# Patient Record
Sex: Female | Born: 1980 | Race: White | Hispanic: No | Marital: Married | Smoking: Never smoker
Health system: Southern US, Community
[De-identification: ages and names within clinical notes are randomized; demographics above are authoritative.]

## PROBLEM LIST (undated history)

## (undated) DIAGNOSIS — F419 Anxiety disorder, unspecified: Secondary | ICD-10-CM

## (undated) DIAGNOSIS — F329 Major depressive disorder, single episode, unspecified: Secondary | ICD-10-CM

## (undated) DIAGNOSIS — R87629 Unspecified abnormal cytological findings in specimens from vagina: Secondary | ICD-10-CM

## (undated) DIAGNOSIS — F32A Depression, unspecified: Secondary | ICD-10-CM

## (undated) DIAGNOSIS — IMO0002 Reserved for concepts with insufficient information to code with codable children: Secondary | ICD-10-CM

## (undated) HISTORY — DX: Depression, unspecified: F32.A

## (undated) HISTORY — DX: Anxiety disorder, unspecified: F41.9

## (undated) HISTORY — PX: EYE SURGERY: SHX253

## (undated) HISTORY — DX: Unspecified abnormal cytological findings in specimens from vagina: R87.629

## (undated) HISTORY — DX: Major depressive disorder, single episode, unspecified: F32.9

---
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
## 2002-02-10 ENCOUNTER — Other Ambulatory Visit: Admission: RE | Admit: 2002-02-10 | Discharge: 2002-02-10 | Payer: Self-pay | Admitting: Internal Medicine

## 2003-03-16 ENCOUNTER — Other Ambulatory Visit: Admission: RE | Admit: 2003-03-16 | Discharge: 2003-03-16 | Payer: Self-pay | Admitting: Internal Medicine

## 2004-03-21 ENCOUNTER — Other Ambulatory Visit: Admission: RE | Admit: 2004-03-21 | Discharge: 2004-03-21 | Payer: Self-pay | Admitting: Family Medicine

## 2005-04-10 ENCOUNTER — Other Ambulatory Visit: Admission: RE | Admit: 2005-04-10 | Discharge: 2005-04-10 | Payer: Self-pay | Admitting: Internal Medicine

## 2005-06-03 ENCOUNTER — Emergency Department (HOSPITAL_COMMUNITY): Admission: EM | Admit: 2005-06-03 | Discharge: 2005-06-03 | Payer: Self-pay | Admitting: Emergency Medicine

## 2006-04-12 ENCOUNTER — Other Ambulatory Visit: Admission: RE | Admit: 2006-04-12 | Discharge: 2006-04-12 | Payer: Self-pay | Admitting: Internal Medicine

## 2007-04-15 ENCOUNTER — Other Ambulatory Visit: Admission: RE | Admit: 2007-04-15 | Discharge: 2007-04-15 | Payer: Self-pay | Admitting: *Deleted

## 2008-04-16 ENCOUNTER — Other Ambulatory Visit: Admission: RE | Admit: 2008-04-16 | Discharge: 2008-04-16 | Payer: Self-pay | Admitting: Family Medicine

## 2008-11-03 ENCOUNTER — Emergency Department (HOSPITAL_COMMUNITY): Admission: EM | Admit: 2008-11-03 | Discharge: 2008-11-03 | Payer: Self-pay | Admitting: Emergency Medicine

## 2009-09-11 IMAGING — CR DG CHEST 2V
2 series · 2 of 2 positions shown · non-contrast
Comparison: None

CLINICAL DATA: Pain, no trauma

CHEST - 2 VIEW

[w chest pa]
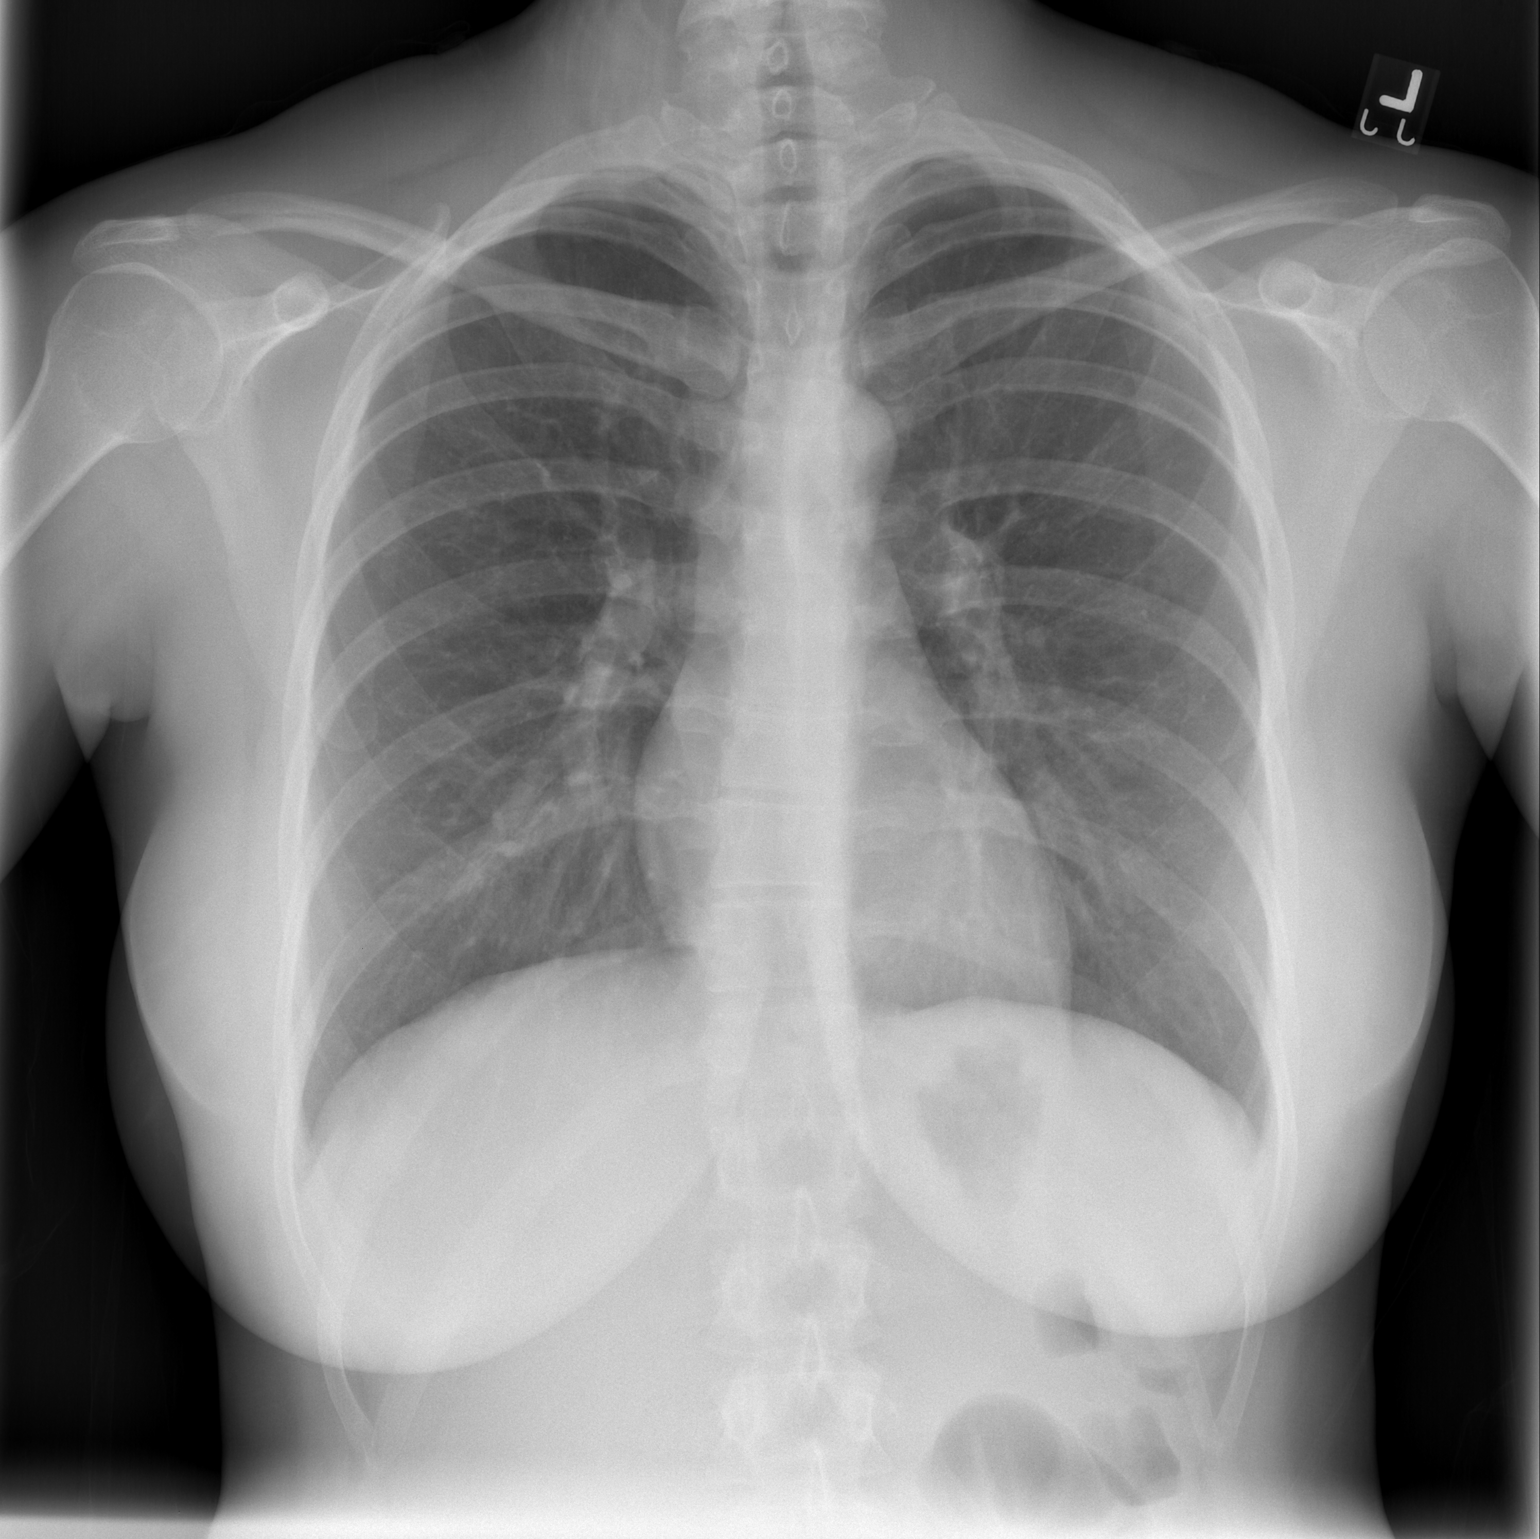

[w chest lat]
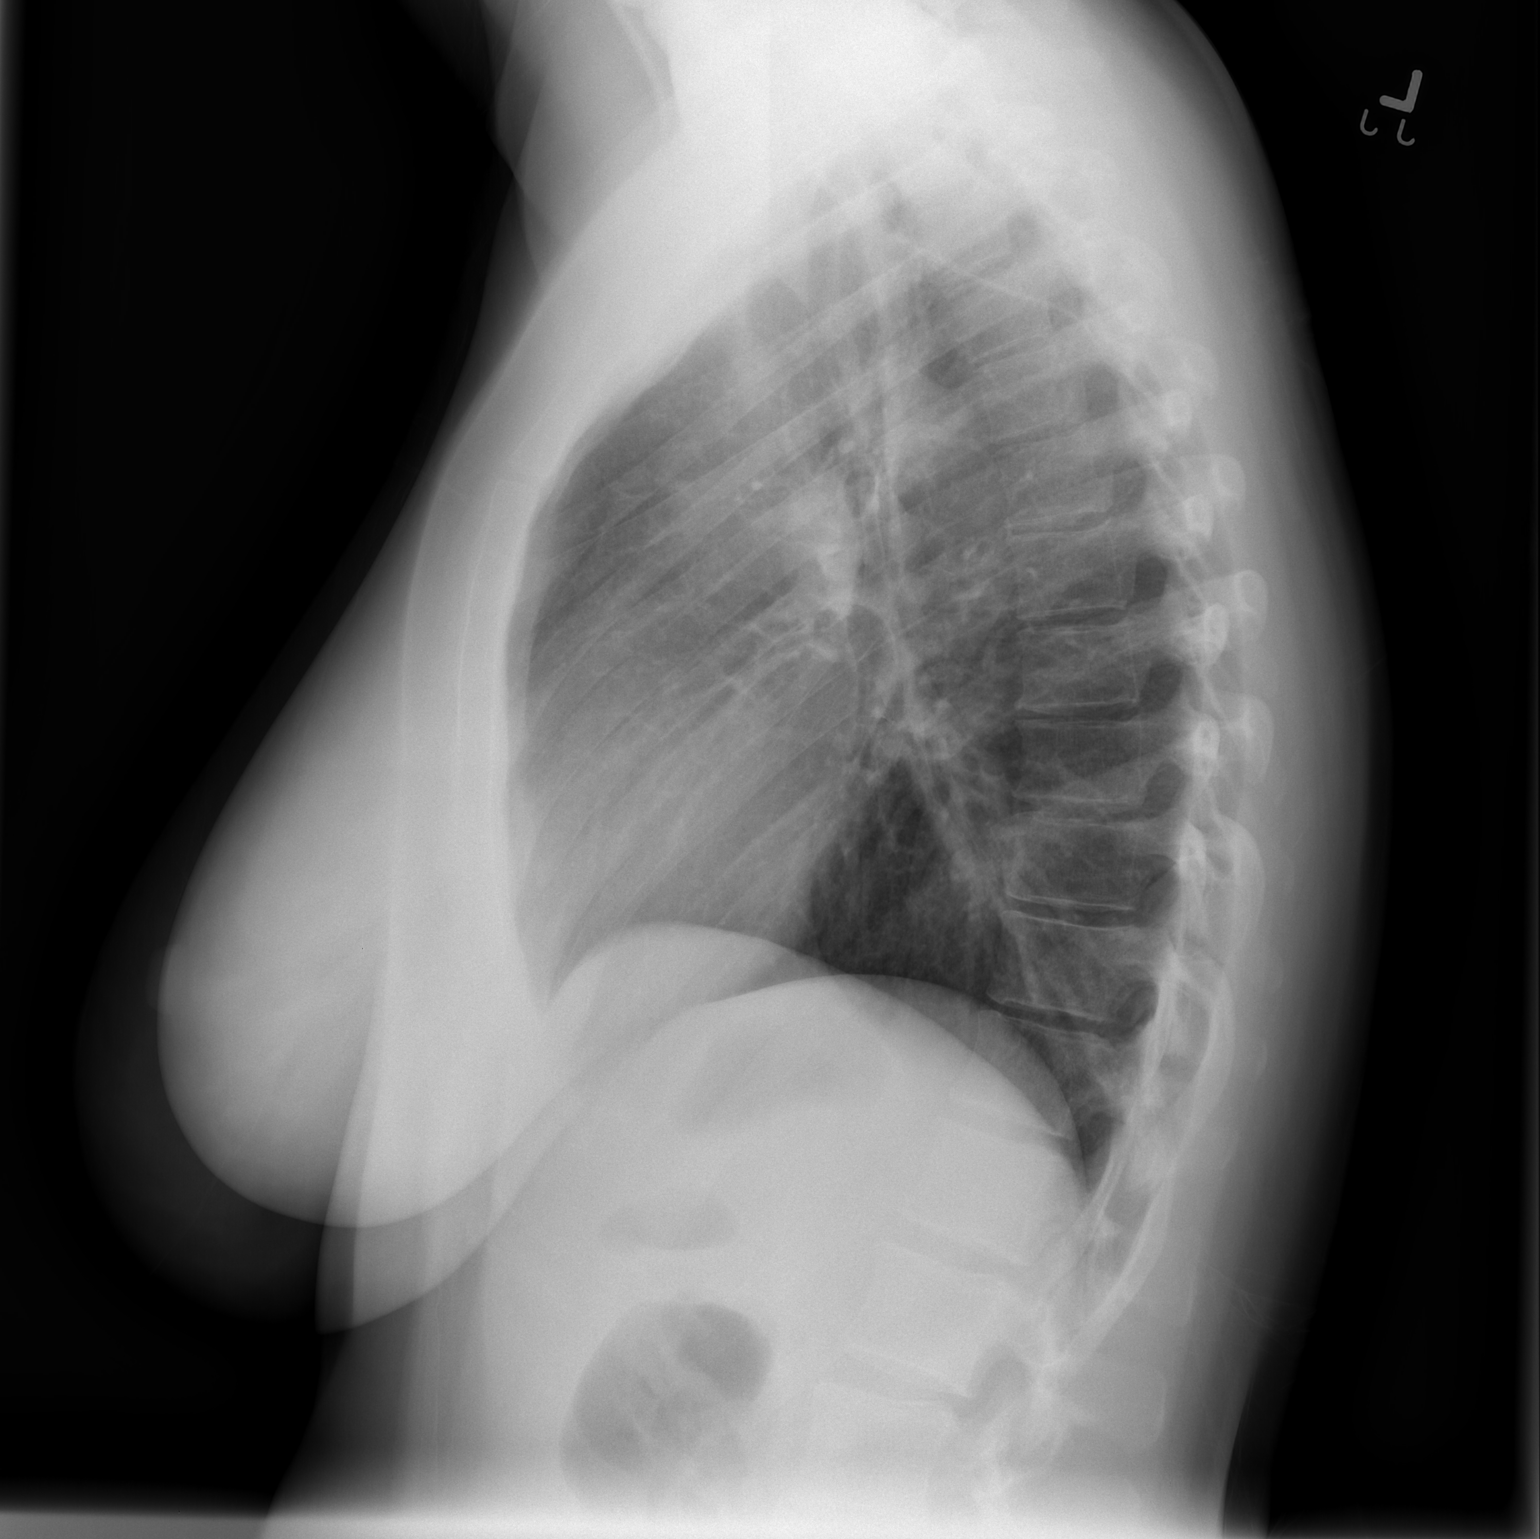

[2 of 2 positions shown; findings below may reference images not displayed]

FINDINGS: Cardiomediastinal silhouette is unremarkable.  Bony
thorax is unremarkable.  No acute infiltrate or edema.  No
pneumothorax.
IMPRESSION: No active disease.

## 2010-04-28 ENCOUNTER — Other Ambulatory Visit: Admission: RE | Admit: 2010-04-28 | Discharge: 2010-04-28 | Payer: Self-pay | Admitting: *Deleted

## 2010-11-07 ENCOUNTER — Other Ambulatory Visit (HOSPITAL_COMMUNITY)
Admission: RE | Admit: 2010-11-07 | Discharge: 2010-11-07 | Disposition: A | Discharge status: Home / Self Care | Payer: BC Managed Care – PPO | Source: Ambulatory Visit | Attending: Internal Medicine | Admitting: Internal Medicine

## 2010-11-07 ENCOUNTER — Other Ambulatory Visit: Payer: Self-pay | Admitting: Physician Assistant

## 2010-11-07 DIAGNOSIS — Z01419 Encounter for gynecological examination (general) (routine) without abnormal findings: Secondary | ICD-10-CM | POA: Insufficient documentation

## 2011-05-10 ENCOUNTER — Other Ambulatory Visit (HOSPITAL_COMMUNITY)
Admission: RE | Admit: 2011-05-10 | Discharge: 2011-05-10 | Disposition: A | Discharge status: Home / Self Care | Payer: BC Managed Care – PPO | Source: Ambulatory Visit | Attending: Family Medicine | Admitting: Family Medicine

## 2011-05-10 ENCOUNTER — Other Ambulatory Visit: Payer: Self-pay | Admitting: Physician Assistant

## 2011-05-10 DIAGNOSIS — Z01419 Encounter for gynecological examination (general) (routine) without abnormal findings: Secondary | ICD-10-CM | POA: Insufficient documentation

## 2011-07-22 HISTORY — PX: BACK SURGERY: SHX140

## 2011-08-28 ENCOUNTER — Ambulatory Visit: Payer: BC Managed Care – PPO | Attending: Neurosurgery | Admitting: Physical Therapy

## 2011-08-28 DIAGNOSIS — IMO0001 Reserved for inherently not codable concepts without codable children: Secondary | ICD-10-CM | POA: Insufficient documentation

## 2011-08-28 DIAGNOSIS — M545 Low back pain, unspecified: Secondary | ICD-10-CM | POA: Insufficient documentation

## 2011-08-28 DIAGNOSIS — R5381 Other malaise: Secondary | ICD-10-CM | POA: Insufficient documentation

## 2011-08-31 ENCOUNTER — Ambulatory Visit: Payer: BC Managed Care – PPO | Admitting: Physical Therapy

## 2011-09-05 ENCOUNTER — Ambulatory Visit: Payer: BC Managed Care – PPO | Admitting: Physical Therapy

## 2011-09-07 ENCOUNTER — Ambulatory Visit: Payer: BC Managed Care – PPO | Admitting: Physical Therapy

## 2011-09-12 ENCOUNTER — Ambulatory Visit: Payer: BC Managed Care – PPO | Admitting: Physical Therapy

## 2011-09-14 ENCOUNTER — Ambulatory Visit: Payer: BC Managed Care – PPO | Admitting: Physical Therapy

## 2011-09-19 ENCOUNTER — Ambulatory Visit: Payer: BC Managed Care – PPO | Admitting: Physical Therapy

## 2011-09-21 ENCOUNTER — Ambulatory Visit: Payer: BC Managed Care – PPO | Admitting: Physical Therapy

## 2011-09-26 ENCOUNTER — Ambulatory Visit: Payer: BC Managed Care – PPO | Attending: Neurosurgery | Admitting: *Deleted

## 2011-09-26 DIAGNOSIS — IMO0001 Reserved for inherently not codable concepts without codable children: Secondary | ICD-10-CM | POA: Insufficient documentation

## 2011-09-26 DIAGNOSIS — M545 Low back pain, unspecified: Secondary | ICD-10-CM | POA: Insufficient documentation

## 2011-09-26 DIAGNOSIS — R5381 Other malaise: Secondary | ICD-10-CM | POA: Insufficient documentation

## 2011-09-28 ENCOUNTER — Ambulatory Visit: Payer: BC Managed Care – PPO | Admitting: *Deleted

## 2011-10-03 ENCOUNTER — Ambulatory Visit: Payer: BC Managed Care – PPO | Admitting: *Deleted

## 2011-10-05 ENCOUNTER — Ambulatory Visit: Payer: BC Managed Care – PPO | Admitting: *Deleted

## 2011-10-10 ENCOUNTER — Ambulatory Visit: Payer: BC Managed Care – PPO | Admitting: Physical Therapy

## 2011-10-12 ENCOUNTER — Ambulatory Visit: Payer: BC Managed Care – PPO | Admitting: Physical Therapy

## 2011-10-16 ENCOUNTER — Encounter: Payer: BC Managed Care – PPO | Admitting: Physical Therapy

## 2011-10-18 ENCOUNTER — Encounter: Payer: BC Managed Care – PPO | Admitting: Physical Therapy

## 2011-10-19 ENCOUNTER — Ambulatory Visit: Payer: BC Managed Care – PPO | Admitting: *Deleted

## 2011-10-31 ENCOUNTER — Ambulatory Visit: Payer: BC Managed Care – PPO | Attending: Neurosurgery | Admitting: Physical Therapy

## 2011-10-31 DIAGNOSIS — M545 Low back pain, unspecified: Secondary | ICD-10-CM | POA: Insufficient documentation

## 2011-10-31 DIAGNOSIS — IMO0001 Reserved for inherently not codable concepts without codable children: Secondary | ICD-10-CM | POA: Insufficient documentation

## 2011-10-31 DIAGNOSIS — R5381 Other malaise: Secondary | ICD-10-CM | POA: Insufficient documentation

## 2011-11-01 ENCOUNTER — Ambulatory Visit: Payer: BC Managed Care – PPO | Admitting: Physical Therapy

## 2011-11-02 ENCOUNTER — Encounter: Payer: BC Managed Care – PPO | Admitting: Physical Therapy

## 2011-11-08 ENCOUNTER — Encounter: Payer: BC Managed Care – PPO | Admitting: Physical Therapy

## 2011-11-09 ENCOUNTER — Ambulatory Visit: Payer: BC Managed Care – PPO | Admitting: Physical Therapy

## 2012-05-14 ENCOUNTER — Other Ambulatory Visit: Payer: Self-pay | Admitting: Family Medicine

## 2012-05-14 ENCOUNTER — Other Ambulatory Visit (HOSPITAL_COMMUNITY)
Admission: RE | Admit: 2012-05-14 | Discharge: 2012-05-14 | Disposition: A | Discharge status: Home / Self Care | Payer: BC Managed Care – PPO | Source: Ambulatory Visit | Attending: Family Medicine | Admitting: Family Medicine

## 2012-05-14 DIAGNOSIS — Z01419 Encounter for gynecological examination (general) (routine) without abnormal findings: Secondary | ICD-10-CM | POA: Insufficient documentation

## 2013-08-21 NOTE — L&D Delivery Note (Signed)
Delivery Note At 9:01 AM a viable female was delivered via OA Presentation Apgars 9 9 Weight pending Placenta status:spontaneously with 3 vessel cord and intact , .  Cord:  with the following complications:nuchal x 1 .  Cord pH: not obtained  Anesthesia:  epidural Episiotomy: none Lacerations: 2nd Suture Repair: 3.0 chromic Est. Blood Loss (mL): 300  Mom to postpartum.  Baby to Couplet care / Skin to Skin.  Tyjay Galindo L 04/23/2014, 9:16 AM

## 2013-09-09 LAB — OB RESULTS CONSOLE RUBELLA ANTIBODY, IGM: Rubella: IMMUNE

## 2013-09-09 LAB — OB RESULTS CONSOLE RPR: RPR: NONREACTIVE

## 2013-09-09 LAB — OB RESULTS CONSOLE GC/CHLAMYDIA
Chlamydia: NEGATIVE
Gonorrhea: NEGATIVE

## 2013-09-09 LAB — OB RESULTS CONSOLE ABO/RH: RH TYPE: POSITIVE

## 2013-09-09 LAB — OB RESULTS CONSOLE ANTIBODY SCREEN: ANTIBODY SCREEN: NEGATIVE

## 2013-09-09 LAB — OB RESULTS CONSOLE HEPATITIS B SURFACE ANTIGEN: HEP B S AG: NEGATIVE

## 2013-09-09 LAB — OB RESULTS CONSOLE HIV ANTIBODY (ROUTINE TESTING): HIV: NONREACTIVE

## 2014-04-22 ENCOUNTER — Telehealth (HOSPITAL_COMMUNITY): Payer: Self-pay | Admitting: *Deleted

## 2014-04-22 ENCOUNTER — Encounter (HOSPITAL_COMMUNITY): Payer: Self-pay | Admitting: *Deleted

## 2014-04-22 ENCOUNTER — Inpatient Hospital Stay (HOSPITAL_COMMUNITY): Payer: BC Managed Care – PPO

## 2014-04-22 LAB — OB RESULTS CONSOLE GBS: STREP GROUP B AG: POSITIVE

## 2014-04-22 NOTE — Telephone Encounter (Signed)
Preadmission screen  

## 2014-04-23 ENCOUNTER — Inpatient Hospital Stay (HOSPITAL_COMMUNITY)
Admission: AD | Admit: 2014-04-23 | Discharge: 2014-04-25 | DRG: 775 | Disposition: A | Discharge status: Home / Self Care | Payer: BC Managed Care – PPO | Source: Ambulatory Visit | Attending: Obstetrics and Gynecology | Admitting: Obstetrics and Gynecology

## 2014-04-23 ENCOUNTER — Encounter (HOSPITAL_COMMUNITY): Payer: Self-pay

## 2014-04-23 ENCOUNTER — Inpatient Hospital Stay (HOSPITAL_COMMUNITY): Payer: BC Managed Care – PPO | Admitting: Anesthesiology

## 2014-04-23 ENCOUNTER — Encounter (HOSPITAL_COMMUNITY): Payer: BC Managed Care – PPO | Admitting: Anesthesiology

## 2014-04-23 DIAGNOSIS — IMO0001 Reserved for inherently not codable concepts without codable children: Secondary | ICD-10-CM

## 2014-04-23 DIAGNOSIS — Z833 Family history of diabetes mellitus: Secondary | ICD-10-CM

## 2014-04-23 DIAGNOSIS — IMO0002 Reserved for concepts with insufficient information to code with codable children: Secondary | ICD-10-CM | POA: Diagnosis present

## 2014-04-23 DIAGNOSIS — O99344 Other mental disorders complicating childbirth: Secondary | ICD-10-CM | POA: Diagnosis present

## 2014-04-23 DIAGNOSIS — F341 Dysthymic disorder: Secondary | ICD-10-CM | POA: Diagnosis present

## 2014-04-23 DIAGNOSIS — Z22330 Carrier of Group B streptococcus: Secondary | ICD-10-CM | POA: Diagnosis not present

## 2014-04-23 DIAGNOSIS — O479 False labor, unspecified: Secondary | ICD-10-CM | POA: Diagnosis present

## 2014-04-23 DIAGNOSIS — O9989 Other specified diseases and conditions complicating pregnancy, childbirth and the puerperium: Secondary | ICD-10-CM

## 2014-04-23 DIAGNOSIS — O99892 Other specified diseases and conditions complicating childbirth: Secondary | ICD-10-CM | POA: Diagnosis present

## 2014-04-23 HISTORY — DX: Reserved for concepts with insufficient information to code with codable children: IMO0002

## 2014-04-23 LAB — CBC
HCT: 34.3 % — ABNORMAL LOW (ref 36.0–46.0)
Hemoglobin: 12.2 g/dL (ref 12.0–15.0)
MCH: 32.8 pg (ref 26.0–34.0)
MCHC: 35.6 g/dL (ref 30.0–36.0)
MCV: 92.2 fL (ref 78.0–100.0)
PLATELETS: 173 10*3/uL (ref 150–400)
RBC: 3.72 MIL/uL — ABNORMAL LOW (ref 3.87–5.11)
RDW: 12.6 % (ref 11.5–15.5)
WBC: 14.8 10*3/uL — ABNORMAL HIGH (ref 4.0–10.5)

## 2014-04-23 LAB — RPR

## 2014-04-23 MED ORDER — ACETAMINOPHEN 325 MG PO TABS: 650.0000 mg | ORAL_TABLET | ORAL | Status: DC | PRN

## 2014-04-23 MED ORDER — ONDANSETRON HCL 4 MG/2ML IJ SOLN: 4.0000 mg | INTRAMUSCULAR | Status: DC | PRN

## 2014-04-23 MED ORDER — FLEET ENEMA 7-19 GM/118ML RE ENEM: 1.0000 | ENEMA | Freq: Every day | RECTAL | Status: DC | PRN

## 2014-04-23 MED ORDER — ZOLPIDEM TARTRATE 5 MG PO TABS: 5.0000 mg | ORAL_TABLET | Freq: Every evening | ORAL | Status: DC | PRN

## 2014-04-23 MED ORDER — OXYTOCIN BOLUS FROM INFUSION
500.0000 mL | INTRAVENOUS | Status: DC
  Administered 2014-04-23: 500 mL via INTRAVENOUS

## 2014-04-23 MED ORDER — CITRIC ACID-SODIUM CITRATE 334-500 MG/5ML PO SOLN: 30.0000 mL | ORAL | Status: DC | PRN

## 2014-04-23 MED ORDER — DIBUCAINE 1 % RE OINT
1.0000 "application " | TOPICAL_OINTMENT | RECTAL | Status: DC | PRN
  Filled 2014-04-23: qty 28

## 2014-04-23 MED ORDER — LIDOCAINE HCL (PF) 1 % IJ SOLN
30.0000 mL | INTRAMUSCULAR | Status: DC | PRN
  Filled 2014-04-23: qty 30

## 2014-04-23 MED ORDER — OXYCODONE-ACETAMINOPHEN 5-325 MG PO TABS: 2.0000 | ORAL_TABLET | ORAL | Status: DC | PRN

## 2014-04-23 MED ORDER — LACTATED RINGERS IV SOLN
INTRAVENOUS | Status: DC
  Administered 2014-04-23: 125 mL/h via INTRAVENOUS
  Administered 2014-04-23: 09:00:00 via INTRAVENOUS

## 2014-04-23 MED ORDER — BENZOCAINE-MENTHOL 20-0.5 % EX AERO
1.0000 "application " | INHALATION_SPRAY | CUTANEOUS | Status: DC | PRN
  Administered 2014-04-23: 1 via TOPICAL
  Filled 2014-04-23 (×3): qty 56

## 2014-04-23 MED ORDER — OXYTOCIN 40 UNITS IN LACTATED RINGERS INFUSION - SIMPLE MED
62.5000 mL/h | INTRAVENOUS | Status: DC
  Administered 2014-04-23: 62.5 mL/h via INTRAVENOUS
  Filled 2014-04-23: qty 1000

## 2014-04-23 MED ORDER — ONDANSETRON HCL 4 MG/2ML IJ SOLN: 4.0000 mg | Freq: Four times a day (QID) | INTRAMUSCULAR | Status: DC | PRN

## 2014-04-23 MED ORDER — EPHEDRINE 5 MG/ML INJ: 10.0000 mg | INTRAVENOUS | Status: DC | PRN

## 2014-04-23 MED ORDER — MEDROXYPROGESTERONE ACETATE 150 MG/ML IM SUSP: 150.0000 mg | INTRAMUSCULAR | Status: DC | PRN

## 2014-04-23 MED ORDER — PRENATAL MULTIVITAMIN CH
1.0000 | ORAL_TABLET | Freq: Every day | ORAL | Status: DC
  Administered 2014-04-23 – 2014-04-24 (×2): 1 via ORAL
  Filled 2014-04-23 (×2): qty 1

## 2014-04-23 MED ORDER — OXYCODONE-ACETAMINOPHEN 5-325 MG PO TABS: 1.0000 | ORAL_TABLET | ORAL | Status: DC | PRN

## 2014-04-23 MED ORDER — PHENYLEPHRINE 40 MCG/ML (10ML) SYRINGE FOR IV PUSH (FOR BLOOD PRESSURE SUPPORT)
80.0000 ug | PREFILLED_SYRINGE | INTRAVENOUS | Status: DC | PRN
  Filled 2014-04-23: qty 10

## 2014-04-23 MED ORDER — DIPHENHYDRAMINE HCL 50 MG/ML IJ SOLN: 12.5000 mg | INTRAMUSCULAR | Status: DC | PRN

## 2014-04-23 MED ORDER — ONDANSETRON HCL 4 MG PO TABS: 4.0000 mg | ORAL_TABLET | ORAL | Status: DC | PRN

## 2014-04-23 MED ORDER — IBUPROFEN 600 MG PO TABS
600.0000 mg | ORAL_TABLET | Freq: Four times a day (QID) | ORAL | Status: DC
  Administered 2014-04-23 – 2014-04-25 (×8): 600 mg via ORAL
  Filled 2014-04-23 (×8): qty 1

## 2014-04-23 MED ORDER — MEASLES, MUMPS & RUBELLA VAC ~~LOC~~ INJ: 0.5000 mL | INJECTION | Freq: Once | SUBCUTANEOUS | Status: DC

## 2014-04-23 MED ORDER — SENNOSIDES-DOCUSATE SODIUM 8.6-50 MG PO TABS
2.0000 | ORAL_TABLET | ORAL | Status: DC
  Administered 2014-04-24: 2 via ORAL
  Filled 2014-04-23 (×2): qty 2

## 2014-04-23 MED ORDER — LANOLIN HYDROUS EX OINT: TOPICAL_OINTMENT | CUTANEOUS | Status: DC | PRN

## 2014-04-23 MED ORDER — FENTANYL 2.5 MCG/ML BUPIVACAINE 1/10 % EPIDURAL INFUSION (WH - ANES)
14.0000 mL/h | INTRAMUSCULAR | Status: DC | PRN
  Administered 2014-04-23: 14 mL/h via EPIDURAL
  Filled 2014-04-23: qty 125

## 2014-04-23 MED ORDER — FLEET ENEMA 7-19 GM/118ML RE ENEM: 1.0000 | ENEMA | RECTAL | Status: DC | PRN

## 2014-04-23 MED ORDER — LACTATED RINGERS IV SOLN
500.0000 mL | INTRAVENOUS | Status: DC | PRN
Start: 2014-04-23 — End: 2014-04-23

## 2014-04-23 MED ORDER — BISACODYL 10 MG RE SUPP
10.0000 mg | Freq: Every day | RECTAL | Status: DC | PRN
  Filled 2014-04-23: qty 1

## 2014-04-23 MED ORDER — DIPHENHYDRAMINE HCL 25 MG PO CAPS: 25.0000 mg | ORAL_CAPSULE | Freq: Four times a day (QID) | ORAL | Status: DC | PRN

## 2014-04-23 MED ORDER — SIMETHICONE 80 MG PO CHEW: 80.0000 mg | CHEWABLE_TABLET | ORAL | Status: DC | PRN

## 2014-04-23 MED ORDER — CLINDAMYCIN PHOSPHATE 900 MG/50ML IV SOLN
900.0000 mg | Freq: Three times a day (TID) | INTRAVENOUS | Status: DC
  Administered 2014-04-23: 900 mg via INTRAVENOUS
  Filled 2014-04-23 (×2): qty 50

## 2014-04-23 MED ORDER — LACTATED RINGERS IV SOLN
500.0000 mL | Freq: Once | INTRAVENOUS | Status: AC
  Administered 2014-04-23: 1000 mL via INTRAVENOUS

## 2014-04-23 MED ORDER — LIDOCAINE HCL (PF) 1 % IJ SOLN
INTRAMUSCULAR | Status: DC | PRN
  Administered 2014-04-23 (×2): 5 mL

## 2014-04-23 MED ORDER — OXYCODONE-ACETAMINOPHEN 5-325 MG PO TABS
1.0000 | ORAL_TABLET | ORAL | Status: DC | PRN
  Administered 2014-04-23 – 2014-04-24 (×3): 1 via ORAL
  Filled 2014-04-23 (×3): qty 1

## 2014-04-23 MED ORDER — WITCH HAZEL-GLYCERIN EX PADS: 1.0000 "application " | MEDICATED_PAD | CUTANEOUS | Status: DC | PRN

## 2014-04-23 MED ORDER — TETANUS-DIPHTH-ACELL PERTUSSIS 5-2.5-18.5 LF-MCG/0.5 IM SUSP: 0.5000 mL | Freq: Once | INTRAMUSCULAR | Status: DC

## 2014-04-23 MED ORDER — PHENYLEPHRINE 40 MCG/ML (10ML) SYRINGE FOR IV PUSH (FOR BLOOD PRESSURE SUPPORT): 80.0000 ug | PREFILLED_SYRINGE | INTRAVENOUS | Status: DC | PRN

## 2014-04-23 NOTE — Lactation Note (Signed)
This note was copied from the chart of Bailey Wright. Lactation Consultation Note  Patient Name: Bailey Wright Today's Date: 04/23/2014 Reason for consult: Initial assessment of this primipara and her newborn at 40 hours postpartum.  Mom says baby just finished breastfeeding for 10 minutes on her right breast.  Mom holding baby STS.  LC demonstrated hand expression and encouraged frequent STS and cue feedings.  Baby has breastfed several times since birth with LATCH scores of 7/9 per RN assessment. Mom encouraged to feed baby 8-12 times/24 hours and with feeding cues. LC encouraged review of Baby and Me pp 9, 14 and 20-25 for STS and BF information. LC provided [employer] brochure and reviewed [facility] services and list of community and web site resources.     Maternal Data Formula Feeding for Exclusion: No Has patient been taught Hand Expression?: Yes (LC demonstrated hand expression technique) Does the patient have breastfeeding experience prior to this delivery?: No  Feeding    LATCH Score/Interventions           LATCH scores=7/9           Lactation Tools Discussed/Used   STS, cue feedings, hand expression  Consult Status Consult Status: Follow-up Date: 04/24/14 Follow-up type: In-patient    Warrick Parisian [facility] 04/23/2014, 8:47 PM

## 2014-04-23 NOTE — Anesthesia Preprocedure Evaluation (Signed)
Anesthesia Evaluation  Patient identified by MRN, date of birth, ID band Patient awake    Reviewed: Allergy & Precautions, H&P , Patient's Chart, lab work & pertinent test results  Airway Mallampati: II TM Distance: >3 FB Neck ROM: full    Dental   Pulmonary  breath sounds clear to auscultation        Cardiovascular Rhythm:regular Rate:Normal     Neuro/Psych    GI/Hepatic   Endo/Other    Renal/GU      Musculoskeletal   Abdominal   Peds  Hematology   Anesthesia Other Findings S/p back surgery  Reproductive/Obstetrics (+) Pregnancy                           Anesthesia Physical Anesthesia Plan  ASA: II  Anesthesia Plan: Epidural   Post-op Pain Management:    Induction:   Airway Management Planned:   Additional Equipment:   Intra-op Plan:   Post-operative Plan:   Informed Consent: I have reviewed the patients History and Physical, chart, labs and discussed the procedure including the risks, benefits and alternatives for the proposed anesthesia with the patient or authorized representative who has indicated his/her understanding and acceptance.     Plan Discussed with:   Anesthesia Plan Comments:         Anesthesia Quick Evaluation

## 2014-04-23 NOTE — Anesthesia Postprocedure Evaluation (Signed)
Anesthesia Post Note  Patient: Bailey Wright  Procedure(s) Performed: * No procedures listed *  Anesthesia type: Epidural  Patient location: Mother/Baby  Post pain: Pain level controlled  Post assessment: Post-op Vital signs reviewed  Last Vitals:  Filed Vitals:   04/23/14 1031  BP: 125/74  Pulse: 73  Temp:   Resp: 20    Post vital signs: Reviewed  Level of consciousness:alert  Complications: No apparent anesthesia complications

## 2014-04-23 NOTE — MAU Note (Signed)
Contractions since 4am. Denies LOF. + bloody show. +FM.

## 2014-04-23 NOTE — Anesthesia Procedure Notes (Signed)
Epidural Patient location during procedure: OB Start time: 04/23/2014 8:14 AM  Staffing Anesthesiologist: Brayton Caves Performed by: anesthesiologist   Preanesthetic Checklist Completed: patient identified, site marked, surgical consent, pre-op evaluation, timeout performed, IV checked, risks and benefits discussed and monitors and equipment checked  Epidural Patient position: sitting Prep: site prepped and draped and DuraPrep Patient monitoring: continuous pulse ox and blood pressure Approach: midline Location: L2-L3 Injection technique: LOR air  Needle:  Needle type: Tuohy  Needle gauge: 17 G Needle length: 9 cm and 9 Needle insertion depth: 6 cm Catheter type: closed end flexible Catheter size: 19 Gauge Catheter at skin depth: 11 cm Test dose: negative  Assessment Events: blood not aspirated, injection not painful, no injection resistance, negative IV test and no paresthesia  Additional Notes Patient identified.  Risk benefits discussed including failed block, incomplete pain control, headache, nerve damage, paralysis, blood pressure changes, nausea, vomiting, reactions to medication both toxic or allergic, and postpartum back pain.  Patient expressed understanding and wished to proceed.  All questions were answered.  Sterile technique used throughout procedure and epidural site dressed with sterile barrier dressing. No paresthesia or other complications noted.The patient did not experience any signs of intravascular injection such as tinnitus or metallic taste in mouth nor signs of intrathecal spread such as rapid motor block. Please see nursing notes for vital signs.

## 2014-04-23 NOTE — H&P (Signed)
Bailey Wright is a 33 y.o. G 1 P 0 at 40 w 3 days presents in active labor. Was 6 cm in triage now C.Status post epidural Maternal Medical History:  Reason for admission: Contractions.   Contractions: Perceived severity is moderate.    Fetal activity: Perceived fetal activity is normal.      OB History   Grav Para Term Preterm Abortions TAB SAB Ect Mult Living       Past Medical History  Diagnosis Date  . Vaginal Pap smear, abnormal   . Anxiety   . Depression   . Degenerative disc disease     diagnosed in high school   Past Surgical History  Procedure Laterality Date  . Eye surgery      lasik  . Back surgery  Dec. 2012    ruptured disc repair   Family History: family history includes Cancer in her maternal grandmother; Diabetes in her maternal grandmother and sister; Heart disease in her maternal grandfather; Thyroid disease in her mother. Social History:  reports that she has never smoked. She has never used smokeless tobacco. She reports that she does not drink alcohol or use illicit drugs.   Prenatal Transfer Tool  Maternal Diabetes: No Genetic Screening: Normal Maternal Ultrasounds/Referrals: Normal Fetal Ultrasounds or other Referrals:  None Maternal Substance Abuse:  No Significant Maternal Medications:  None Significant Maternal Lab Results:  None Other Comments:  None  Review of Systems  All other systems reviewed and are negative.   Dilation: 6 (BBOW) Effacement (%): 90;100 Station: -1 Exam by:: D Simpson RN Blood pressure 127/71, pulse 76, temperature 98.4 F (36.9 C), temperature source Oral, resp. rate 18, height 5' 5.5" (1.664 m), weight 98.068 kg (216 lb 3.2 oz), last menstrual period 07/14/2013. Maternal Exam:  Uterine Assessment: Contraction strength is moderate.  Contraction frequency is regular.   Abdomen: Fetal presentation: vertex     Fetal Exam Fetal State Assessment: Category I - tracings are  normal.     Physical Exam  Nursing note and vitals reviewed. Constitutional: She appears well-developed and well-nourished.  HENT:  Head: Normocephalic.  Eyes: Pupils are equal, round, and reactive to light.  Cardiovascular: Normal rate and regular rhythm.   Respiratory: Effort normal.    Prenatal labs: ABO, Rh: B/Positive/-- (01/20 0000) Antibody: Negative (01/20 0000) Rubella: Immune (01/20 0000) RPR: Nonreactive (01/20 0000)  HBsAg: Negative (01/20 0000)  HIV: Non-reactive (01/20 0000)  GBS: Positive (09/02 0000)   Assessment/Plan: IUP at 40 w 3 days Labor Anticipate NSVD GBBS + antibiotic prophylaxis  Bailey Wright 04/23/2014, 8:36 AM

## 2014-04-24 LAB — CBC
HCT: 29.3 % — ABNORMAL LOW (ref 36.0–46.0)
Hemoglobin: 10.1 g/dL — ABNORMAL LOW (ref 12.0–15.0)
MCH: 32.4 pg (ref 26.0–34.0)
MCHC: 34.5 g/dL (ref 30.0–36.0)
MCV: 93.9 fL (ref 78.0–100.0)
PLATELETS: 153 10*3/uL (ref 150–400)
RBC: 3.12 MIL/uL — ABNORMAL LOW (ref 3.87–5.11)
RDW: 12.9 % (ref 11.5–15.5)
WBC: 12.8 10*3/uL — ABNORMAL HIGH (ref 4.0–10.5)

## 2014-04-24 NOTE — Progress Notes (Signed)
Clinical Social Work Department PSYCHOSOCIAL ASSESSMENT - MATERNAL/CHILD 04/24/2014  Patient:  Bailey Wright, Bailey Wright  Account Number:  [id number]  Admit Date:  04/23/2014  Ardine Eng Name:   Lurline Hare   Clinical Social Worker:  Lucita Ferrara, CLINICAL SOCIAL WORKER   Date/Time:  04/24/2014 12:15 PM  Date Referred:  04/24/2014   Referral source  Central Nursery     Referred reason  Depression/Anxiety   Other referral source:    I:  FAMILY / [place] legal guardian:  PARENT  Guardian - Name Guardian - Age Guardian - Address  Courtnee Myer [address], [place] [address]  Tyler Olson  Same as above   Other household support members/support persons Other support:   MOB and FOB discussed that both sets of their parents live nearby, and confirmed positive and supportive relationships with them. They shared that they feel well supported by their family as they become first time parents.    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   Financial resources:  Multimedia programmer If Concepcion:    School / Grade:  N/A Music therapist / Child Services Coordination / Early Interventions:   N/A  Cultural issues impacting care:   None reported    III  STRENGTHS Strengths  Adequate Resources  Home prepared for Child (including basic supplies)  Supportive family/friends   Strength comment:    IV  RISK FACTORS AND CURRENT PROBLEMS Current Problem:  YES   Risk Factor & Current Problem Patient Issue Family Issue Risk Factor / Current Problem Comment  Mental Illness Y N MOB presents with history of anxiety and depression.  MOB reported that she received treatment in the past, but is currently not taking any medications or in therapy due to not experiencing any symptoms.    V  SOCIAL WORK ASSESSMENT CSW met with MOB  and FOB in order to complete assessment. Consult ordered due to MOB presenting with a history of anxiety and depression.  MOB and FOB were easily engaged in the assessment, and were receptive to discussing the reason for consult.  MOB presented with appropriate range in affect and was observed to be in a pleasant mood.  MOB and FOB thanked CSW at the end of assessment, and CSW did not note any acute mental health symptoms or psychosocial stressors.   MOB and FOB expressed excitement when they learned that they were pregnant and as they prepare to transition home.  CSW validated and normalized anxiety as they transition to becoming first time parents.  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  She denied any acute anxiety, and MOB and FOB discussed feeling well supported by their families who live within a few miles of their home.  MOB and FOB shared that they also feel well supported by their employers, and MOB denied any stress related to disengaging from her professional identify while she stays at home with her newborn.  MOB and FOB identified that they will have one upcoming change in the near future as they are currently building a new home (very close to current home) and will be moving in within one month.  They denied any concerns related to moving with a newborn, and expressed excitement to move.    CSW  inquired about mental health history. MOB acknowledged history of anxiety, and stated that onset occurred right after college as she transitioned to becoming a Pharmacist, hospital.  She shared that she would have panic attacks and that due to her symptoms, she sought medication management and therapy.  Per MOB, due to stabilization of symptoms, she discontinued outpatient treatment 2-3 years ago.  Once she decreased routine outpatient treatment, she continued to take Xanax PRN until she became pregnant. During her pregnancy, she stated  that she at times would begin to feel overwhelmed, but was able to self-regulate her emotions prior to it becoming a panic attack.  CSW explored with MOB early signs that she may experiencing a panic attack.  MOB presented with self-awareness as she discussed ability to identify early signs such as shortness of breath and tingling fingers.  MOB denied recent acute symptoms, but is aware of her increased risk for developing postpartum depression due to her mental health history.  MOB and FOB receptive to education on postpartum depression, and expressed motivation to follow-up with medical providers if symptoms occur.   No barriers to discharge.    VI SOCIAL WORK PLAN Social Work Therapist, art  No Further Intervention Required / No Barriers to Discharge   Type of pt/family education:   Postpartum depression and anxiety   If child protective services report - county:   If child protective services report - date:   Information/referral to community resources comment:   Other social work plan:   CSW to provide ongoing emotional support PRN.

## 2014-04-24 NOTE — Lactation Note (Signed)
This note was copied from the chart of Bailey Amado Coe. Lactation Consultation Note Mom trying to latch baby in awkward cradle hold. Offered to assist, mom accepts.  Assisted mom to place baby in football, and demonstrated pillow nest with towel rolls to support baby. Mom much more comfortable.  Discussed position options using a doll. Reviewed latch basics. Baby latches well with audible swallowing, mom denies nipple pain.  Enc mom to call for help with feeds if needed.    Patient Name: Bailey Wright [id number] Date: 04/24/2014 Reason for consult: Follow-up assessment   Maternal Data    Feeding Feeding Type: Breast Fed Length of feed: 15 min  LATCH Score/Interventions Latch: Grasps breast easily, tongue down, lips flanged, rhythmical sucking.  Audible Swallowing: Spontaneous and intermittent  Type of Nipple: Everted at rest and after stimulation  Comfort (Breast/Nipple): Soft / non-tender     Hold (Positioning): Assistance needed to correctly position infant at breast and maintain latch. Intervention(s): Breastfeeding basics reviewed;Support Pillows;Position options  LATCH Score: 9  Lactation Tools Discussed/Used     Consult Status Consult Status: PRN    Lenard Forth 04/24/2014, 2:51 PM

## 2014-04-24 NOTE — Progress Notes (Signed)
Post Partum Day 1 Subjective: no complaints, up ad lib, voiding and tolerating PO  Objective: Blood pressure 115/55, pulse 70, temperature 98.4 F (36.9 C), temperature source Oral, resp. rate 16, height 5' 5.5" (1.664 m), weight 216 lb 3.2 oz (98.068 kg), last menstrual period 07/14/2013, SpO2 98.00%, unknown if currently breastfeeding.  Physical Exam:  General: alert and cooperative Lochia: appropriate Uterine Fundus: firm Incision: perineum intact DVT Evaluation: No evidence of DVT seen on physical exam. Negative Homan's sign. No cords or calf tenderness. No significant calf/ankle edema.   Recent Labs  04/23/14 0705 04/24/14 0620  HGB 12.2 10.1*  HCT 34.3* 29.3*    Assessment/Plan: Plan for discharge tomorrow   LOS: 1 day   Bailey Wright 04/24/2014, 8:26 AM

## 2014-04-25 MED ORDER — IBUPROFEN 600 MG PO TABS: 600.0000 mg | ORAL_TABLET | Freq: Four times a day (QID) | ORAL | Status: DC

## 2014-04-25 MED ORDER — OXYCODONE-ACETAMINOPHEN 5-325 MG PO TABS: 1.0000 | ORAL_TABLET | ORAL | Status: DC | PRN

## 2014-04-25 NOTE — Plan of Care (Signed)
Problem: Discharge Progression Outcomes Goal: Discharge plan in place and appropriate Outcome: Completed/Met Date Met:  04/25/14 Mom breastfeeding. Confident with discharge intsructions

## 2014-04-25 NOTE — Discharge Summary (Signed)
Obstetric Discharge Summary Reason for Admission: onset of labor Prenatal Procedures: none Intrapartum Procedures: spontaneous vaginal delivery Postpartum Procedures: none Complications-Operative and Postpartum: 2nd degree perineal laceration Hemoglobin  Date Value Ref Range Status  04/24/2014 10.1* 12.0 - 15.0 g/dL Final     DELTA CHECK NOTED     REPEATED TO VERIFY     HCT  Date Value Ref Range Status  04/24/2014 29.3* 36.0 - 46.0 % Final    Physical Exam:  General: alert, cooperative and appears stated age Lochia: appropriate Uterine Fundus: firm Incision: healing well, no significant drainage, no dehiscence DVT Evaluation: No evidence of DVT seen on physical exam. Negative Homan's sign. No cords or calf tenderness.  Discharge Diagnoses: Term Pregnancy-delivered  Discharge Information: Date: 04/25/2014 Activity: pelvic rest Diet: routine Medications: PNV, Ibuprofen and Percocet Condition: stable Instructions: refer to practice specific booklet Discharge to: home   Newborn Data: Live born female  Birth Weight: 8 lb 7 oz (3827 g) APGAR: 8, 9  Home with mother.  Murphy Bundick 04/25/2014, 10:00 AM

## 2014-04-25 NOTE — Lactation Note (Signed)
This note was copied from the chart of Bailey Amado Coe. Lactation Consultation Note      Follow up consult with this mom and baby, now 76 hours old, and being discharged to home today. I asisted mom with latching baby, and she latched in cross cradle easily, and mom reports initilal discomfort for about 30 seconds, and then comfortable. I showed her how using cross cradle as opposed to cradle for first 2-3 weeks, help with a deeper latch. Breast care, engorgement care reviewed with mom. I encouraged mom to come to BF support group, and to call with questions/concerns, as needed.   Patient Name: Bailey Wright [id number] Date: 04/25/2014 Reason for consult: Follow-up assessment   Maternal Data    Feeding Feeding Type: Breast Fed  LATCH Score/Interventions Latch: Grasps breast easily, tongue down, lips flanged, rhythmical sucking.  Audible Swallowing: A few with stimulation  Type of Nipple: Everted at rest and after stimulation  Comfort (Breast/Nipple): Soft / non-tender     Hold (Positioning): Assistance needed to correctly position infant at breast and maintain latch. Intervention(s): Breastfeeding basics reviewed;Support Pillows;Position options;Skin to skin  LATCH Score: 8  Lactation Tools Discussed/Used     Consult Status Consult Status: Complete Follow-up type: Call as needed    Alfred Levins 04/25/2014, 10:54 AM

## 2014-04-25 NOTE — Discharge Instructions (Signed)
Call MD for T>100.4, heavy vaginal bleeding, severe abdominal pain, intractable nausea and/or vomiting, or respiratory distress.  Call office to schedule postpartum appointment in 4-6 weeks.  No driving while taking narcotics.  Pelvic rest x 6 weeks. °

## 2014-04-26 ENCOUNTER — Inpatient Hospital Stay (HOSPITAL_COMMUNITY): Admission: RE | Admit: 2014-04-26 | Payer: BC Managed Care – PPO | Source: Ambulatory Visit

## 2014-05-18 ENCOUNTER — Other Ambulatory Visit: Payer: Self-pay | Admitting: Obstetrics and Gynecology

## 2014-05-21 LAB — CYTOLOGY - PAP

## 2014-06-01 ENCOUNTER — Other Ambulatory Visit: Payer: Self-pay | Admitting: Obstetrics and Gynecology

## 2014-06-02 LAB — CYTOLOGY - PAP

## 2014-06-22 ENCOUNTER — Encounter (HOSPITAL_COMMUNITY): Payer: Self-pay

## 2015-05-03 ENCOUNTER — Other Ambulatory Visit: Payer: Self-pay | Admitting: Obstetrics and Gynecology

## 2015-05-03 NOTE — H&P (Signed)
NAMEMarland Kitchen  ADEA, GEISEL NO.:  [id number]  MEDICAL RECORD NO.:  [id number]  LOCATION:  PERIO                         FACILITY:  [facility]  PHYSICIAN:  Duke Salvia. Marcelle Overlie, M.D.DATE OF BIRTH:  1981/07/07  DATE OF ADMISSION:  05/04/2015 DATE OF DISCHARGE:                             HISTORY & PHYSICAL   CHIEF COMPLAINT:  Missed AB.  HISTORY OF PRESENT ILLNESS:  A 34 year old, G2, P1-0-0-1 at 9 weeks presented to the office today after having previously noted positive FHR on ultrasound, now at 9 weeks with no FHR noted, presents now for D and E.  She is B positive.  The procedure of D and E reviewed with including specific risks related to bleeding, infection, other complications, may require additional surgery, which she understands and accepts.  PAST MEDICAL HISTORY:  Remainder of her history please see the [facility] form.  ALLERGIES:  Penicillin.  PHYSICAL EXAMINATION:  VITAL SIGNS:  Temp 98.2, blood pressure 120/78. HEENT:  Unremarkable. NECK:  Supple without masses. LUNGS:  Clear. CARDIOVASCULAR:  Regular rate and rhythm without murmurs, rubs, or gallops. BREASTS:  Without masses. ABDOMEN:  Soft, flat, nontender. PELVIC:  Vulva, vagina, cervix normal.  Uterus was 9 week size, mid position.  Adnexa negative. EXTREMITIES:  Unremarkable. NEUROLOGIC:  Unremarkable.  IMPRESSION:  Missed abortion.  PLAN:  D and E procedure and risks discussed as above.     Bailey Wright M. Marcelle Overlie, M.D.     RM[facility]/MEDQ  D:  05/03/2015  T:  05/03/2015  Job:  [id number]

## 2015-05-03 NOTE — H&P (Signed)
Bailey Wright  DICTATION # [phone] CSN# [id number]   Meriel Pica, MD 05/03/2015 6:52 PM

## 2015-05-04 ENCOUNTER — Encounter (HOSPITAL_COMMUNITY): Payer: Self-pay | Admitting: Emergency Medicine

## 2015-05-04 ENCOUNTER — Ambulatory Visit (HOSPITAL_COMMUNITY): Payer: BC Managed Care – PPO | Admitting: Anesthesiology

## 2015-05-04 ENCOUNTER — Ambulatory Visit (HOSPITAL_COMMUNITY)
Admission: RE | Admit: 2015-05-04 | Discharge: 2015-05-04 | Disposition: A | Discharge status: Home / Self Care | Payer: BC Managed Care – PPO | Source: Ambulatory Visit | Attending: Obstetrics and Gynecology | Admitting: Obstetrics and Gynecology

## 2015-05-04 ENCOUNTER — Encounter (HOSPITAL_COMMUNITY)
Admission: RE | Disposition: A | Discharge status: Home / Self Care | Payer: Self-pay | Source: Ambulatory Visit | Attending: Obstetrics and Gynecology

## 2015-05-04 DIAGNOSIS — O021 Missed abortion: Secondary | ICD-10-CM | POA: Insufficient documentation

## 2015-05-04 HISTORY — PX: DILATION AND EVACUATION: SHX1459

## 2015-05-04 LAB — CYTOLOGY - PAP

## 2015-05-04 SURGERY — DILATION AND EVACUATION, UTERUS
Anesthesia: Monitor Anesthesia Care | Site: Vagina

## 2015-05-04 MED ORDER — LACTATED RINGERS IV SOLN
INTRAVENOUS | Status: DC
Start: 2015-05-04 — End: 2015-05-04
  Administered 2015-05-04 (×2): via INTRAVENOUS

## 2015-05-04 MED ORDER — PROPOFOL 10 MG/ML IV BOLUS
INTRAVENOUS | Status: AC
  Filled 2015-05-04: qty 20

## 2015-05-04 MED ORDER — IBUPROFEN 800 MG PO TABS: 800.0000 mg | ORAL_TABLET | Freq: Three times a day (TID) | ORAL | Status: DC | PRN

## 2015-05-04 MED ORDER — LIDOCAINE HCL 1 % IJ SOLN
INTRAMUSCULAR | Status: DC | PRN
  Administered 2015-05-04: 17 mL

## 2015-05-04 MED ORDER — PROPOFOL 10 MG/ML IV BOLUS
INTRAVENOUS | Status: AC
Start: 2015-05-04 — End: 2015-05-04
  Filled 2015-05-04: qty 20

## 2015-05-04 MED ORDER — LIDOCAINE HCL (CARDIAC) 20 MG/ML IV SOLN
INTRAVENOUS | Status: DC | PRN
  Administered 2015-05-04: 100 mg via INTRAVENOUS

## 2015-05-04 MED ORDER — HYDROCODONE-ACETAMINOPHEN 7.5-325 MG PO TABS: 1.0000 | ORAL_TABLET | Freq: Once | ORAL | Status: DC | PRN

## 2015-05-04 MED ORDER — FENTANYL CITRATE (PF) 100 MCG/2ML IJ SOLN: 25.0000 ug | INTRAMUSCULAR | Status: DC | PRN

## 2015-05-04 MED ORDER — DEXAMETHASONE SODIUM PHOSPHATE 4 MG/ML IJ SOLN
INTRAMUSCULAR | Status: DC | PRN
  Administered 2015-05-04: 4 mg via INTRAVENOUS

## 2015-05-04 MED ORDER — PROPOFOL 10 MG/ML IV BOLUS
INTRAVENOUS | Status: DC | PRN
  Administered 2015-05-04: 30 mg via INTRAVENOUS
  Administered 2015-05-04 (×2): 20 mg via INTRAVENOUS
  Administered 2015-05-04 (×2): 30 mg via INTRAVENOUS
  Administered 2015-05-04 (×2): 20 mg via INTRAVENOUS
  Administered 2015-05-04: 50 mg via INTRAVENOUS
  Administered 2015-05-04: 30 mg via INTRAVENOUS
  Administered 2015-05-04: 20 mg via INTRAVENOUS

## 2015-05-04 MED ORDER — MEPERIDINE HCL 25 MG/ML IJ SOLN: 6.2500 mg | INTRAMUSCULAR | Status: DC | PRN

## 2015-05-04 MED ORDER — LIDOCAINE HCL 1 % IJ SOLN
INTRAMUSCULAR | Status: AC
  Filled 2015-05-04: qty 20

## 2015-05-04 MED ORDER — KETOROLAC TROMETHAMINE 30 MG/ML IJ SOLN
INTRAMUSCULAR | Status: AC
  Filled 2015-05-04: qty 1

## 2015-05-04 MED ORDER — ONDANSETRON HCL 4 MG/2ML IJ SOLN
INTRAMUSCULAR | Status: AC
  Filled 2015-05-04: qty 2

## 2015-05-04 MED ORDER — METOCLOPRAMIDE HCL 5 MG/ML IJ SOLN: 10.0000 mg | Freq: Once | INTRAMUSCULAR | Status: DC | PRN

## 2015-05-04 MED ORDER — MIDAZOLAM HCL 2 MG/2ML IJ SOLN
INTRAMUSCULAR | Status: AC
  Filled 2015-05-04: qty 4

## 2015-05-04 MED ORDER — SCOPOLAMINE 1 MG/3DAYS TD PT72
1.0000 | MEDICATED_PATCH | Freq: Once | TRANSDERMAL | Status: DC
  Administered 2015-05-04: 1.5 mg via TRANSDERMAL

## 2015-05-04 MED ORDER — FENTANYL CITRATE (PF) 100 MCG/2ML IJ SOLN
INTRAMUSCULAR | Status: AC
  Filled 2015-05-04: qty 4

## 2015-05-04 MED ORDER — LIDOCAINE HCL (CARDIAC) 20 MG/ML IV SOLN
INTRAVENOUS | Status: AC
  Filled 2015-05-04: qty 5

## 2015-05-04 MED ORDER — ONDANSETRON HCL 4 MG/2ML IJ SOLN
INTRAMUSCULAR | Status: DC | PRN
  Administered 2015-05-04: 4 mg via INTRAVENOUS

## 2015-05-04 MED ORDER — MIDAZOLAM HCL 5 MG/5ML IJ SOLN
INTRAMUSCULAR | Status: DC | PRN
  Administered 2015-05-04: 2 mg via INTRAVENOUS

## 2015-05-04 MED ORDER — KETOROLAC TROMETHAMINE 30 MG/ML IJ SOLN
INTRAMUSCULAR | Status: DC | PRN
  Administered 2015-05-04: 30 mg via INTRAVENOUS

## 2015-05-04 MED ORDER — FENTANYL CITRATE (PF) 100 MCG/2ML IJ SOLN
INTRAMUSCULAR | Status: DC | PRN
  Administered 2015-05-04 (×2): 50 ug via INTRAVENOUS

## 2015-05-04 MED ORDER — HYDROCODONE-IBUPROFEN 7.5-200 MG PO TABS: 1.0000 | ORAL_TABLET | Freq: Three times a day (TID) | ORAL | Status: DC | PRN

## 2015-05-04 MED ORDER — SCOPOLAMINE 1 MG/3DAYS TD PT72
MEDICATED_PATCH | TRANSDERMAL | Status: AC
  Administered 2015-05-04: 1.5 mg via TRANSDERMAL
  Filled 2015-05-04: qty 1

## 2015-05-04 MED ORDER — DEXAMETHASONE SODIUM PHOSPHATE 4 MG/ML IJ SOLN
INTRAMUSCULAR | Status: AC
  Filled 2015-05-04: qty 1

## 2015-05-04 SURGICAL SUPPLY — 16 items
CATH ROBINSON RED A/P 16FR (CATHETERS) ×3 IMPLANT
CLOTH BEACON ORANGE TIMEOUT ST (SAFETY) ×3 IMPLANT
DECANTER SPIKE VIAL GLASS SM (MISCELLANEOUS) ×3 IMPLANT
GLOVE BIO SURGEON STRL SZ7 (GLOVE) ×6 IMPLANT
GOWN STRL REUS W/TWL LRG LVL3 (GOWN DISPOSABLE) ×6 IMPLANT
KIT BERKELEY 1ST TRIMESTER 3/8 (MISCELLANEOUS) ×3 IMPLANT
NS IRRIG 1000ML POUR BTL (IV SOLUTION) ×3 IMPLANT
PACK VAGINAL MINOR WOMEN LF (CUSTOM PROCEDURE TRAY) ×3 IMPLANT
PAD OB MATERNITY 4.3X12.25 (PERSONAL CARE ITEMS) ×3 IMPLANT
PAD PREP 24X48 CUFFED NSTRL (MISCELLANEOUS) ×3 IMPLANT
SET BERKELEY SUCTION TUBING (SUCTIONS) ×3 IMPLANT
TOWEL OR 17X24 6PK STRL BLUE (TOWEL DISPOSABLE) ×6 IMPLANT
VACURETTE 10 RIGID CVD (CANNULA) IMPLANT
VACURETTE 7MM CVD STRL WRAP (CANNULA) IMPLANT
VACURETTE 8 RIGID CVD (CANNULA) ×3 IMPLANT
VACURETTE 9 RIGID CVD (CANNULA) IMPLANT

## 2015-05-04 NOTE — Op Note (Signed)
Preoperative diagnosis: Missed AB  Postoperative diagnosis: Same  Procedure: Dilatation and evacuation  Surgeon: Marcelle Overlie  Anesthesia: Gen., plus paracervical block  EBL: 50 cc  Procedure and findings:  Patient taken the operating room after an adequate level of general anesthesia was obtained the patient's legs in stirrups the perineum and vagina were prepped and draped, bladder was drained. Appropriate timeouts taken at that point. EUA was carried out the uterus was 9-10 week size, mid position. Speculum was positioned paracervical block was then created by infiltrating at 3 and 9:00 submucosally, 5-7 cc of 1% plain Xylocaine at each site after negative aspiration. Uterus is sounded to 10 cm progressively dilated to 29 Pratt dilator. A #8 curved suction curet was then used to curette a moderate amount of tissue when no further tissue could be removed a medium blunt curette was used to explore the cavity revealing it to be clean. There was minimal bleeding she tolerated this well went to recovery room in good condition.  Dictated with [facility]  Niana Martorana Milana Obey M.D.

## 2015-05-04 NOTE — Transfer of Care (Signed)
Immediate Anesthesia Transfer of Care Note  Patient: Bailey Wright  Procedure(s) Performed: Procedure(s): DILATATION AND EVACUATION (N/A)  Patient Location: PACU  Anesthesia Type:MAC  Level of Consciousness: awake and alert   Airway & Oxygen Therapy: Patient Spontanous Breathing and Patient connected to nasal cannula oxygen  Post-op Assessment: Report given to RN and Post -op Vital signs reviewed and stable  Post vital signs: Reviewed and stable  Last Vitals:  Filed Vitals:   05/04/15 1139  BP: 124/72  Pulse: 62  Temp: 36.9 C  Resp: 16    Complications: No apparent anesthesia complications

## 2015-05-04 NOTE — Anesthesia Preprocedure Evaluation (Signed)
Anesthesia Evaluation  Patient identified by MRN, date of birth, ID band Patient awake    Reviewed: Allergy & Precautions, NPO status , Patient's Chart, lab work & pertinent test results  Airway Mallampati: II  TM Distance: >3 FB Neck ROM: Full    Dental no notable dental hx. (+) Teeth Intact   Pulmonary neg pulmonary ROS,    Pulmonary exam normal breath sounds clear to auscultation       Cardiovascular negative cardio ROS Normal cardiovascular exam Rhythm:Regular Rate:Normal     Neuro/Psych PSYCHIATRIC DISORDERS Anxiety Depression negative neurological ROS     GI/Hepatic negative GI ROS, Neg liver ROS,   Endo/Other  Obesity  Renal/GU negative Renal ROS  negative genitourinary   Musculoskeletal  (+) Arthritis , DDD   Abdominal (+) + obese,   Peds  Hematology   Anesthesia Other Findings   Reproductive/Obstetrics (+) Pregnancy Missed Ab                             Anesthesia Physical Anesthesia Plan  ASA: II  Anesthesia Plan: MAC   Post-op Pain Management:    Induction: Intravenous  Airway Management Planned: Mask and Natural Airway  Additional Equipment:   Intra-op Plan:   Post-operative Plan:   Informed Consent: I have reviewed the patients History and Physical, chart, labs and discussed the procedure including the risks, benefits and alternatives for the proposed anesthesia with the patient or authorized representative who has indicated his/her understanding and acceptance.     Plan Discussed with: CRNA, Anesthesiologist and Surgeon  Anesthesia Plan Comments:         Anesthesia Quick Evaluation

## 2015-05-04 NOTE — Progress Notes (Signed)
The patient was re-examined with no change in statusThe patient was re-examined with no change in status 

## 2015-05-04 NOTE — Anesthesia Postprocedure Evaluation (Signed)
  Anesthesia Post-op Note  Patient: Bailey Wright  Procedure(s) Performed: Procedure(s) (LRB): DILATATION AND EVACUATION (N/A)  Patient Location: PACU  Anesthesia Type: General  Level of Consciousness: awake and alert   Airway and Oxygen Therapy: Patient Spontanous Breathing  Post-op Pain: mild  Post-op Assessment: Post-op Vital signs reviewed, Patient's Cardiovascular Status Stable, Respiratory Function Stable, Patent Airway and No signs of Nausea or vomiting  Last Vitals:  Filed Vitals:   05/04/15 1400  BP: 112/66  Pulse: 61  Temp:   Resp: 16    Post-op Vital Signs: stable   Complications: No apparent anesthesia complications

## 2015-05-04 NOTE — Anesthesia Procedure Notes (Signed)
Procedure Name: MAC Date/Time: 05/04/2015 1:00 PM Performed by: Junious Silk Pre-anesthesia Checklist: Patient identified, Emergency Drugs available, Suction available, Patient being monitored and Timeout performed Patient Re-evaluated:Patient Re-evaluated prior to inductionOxygen Delivery Method: Circle system utilized Preoxygenation: Pre-oxygenation with 100% oxygen Dental Injury: Teeth and Oropharynx as per pre-operative assessment

## 2015-05-05 ENCOUNTER — Encounter (HOSPITAL_COMMUNITY): Payer: Self-pay | Admitting: Obstetrics and Gynecology

## 2016-05-18 LAB — OB RESULTS CONSOLE HIV ANTIBODY (ROUTINE TESTING): HIV: NONREACTIVE

## 2016-05-18 LAB — OB RESULTS CONSOLE RUBELLA ANTIBODY, IGM: RUBELLA: IMMUNE

## 2016-05-18 LAB — OB RESULTS CONSOLE HEPATITIS B SURFACE ANTIGEN: HEP B S AG: NEGATIVE

## 2016-05-18 LAB — OB RESULTS CONSOLE RPR: RPR: NONREACTIVE

## 2016-05-24 LAB — OB RESULTS CONSOLE GC/CHLAMYDIA
Chlamydia: NEGATIVE
Gonorrhea: NEGATIVE

## 2016-11-28 LAB — OB RESULTS CONSOLE GBS: STREP GROUP B AG: NEGATIVE

## 2016-12-14 ENCOUNTER — Inpatient Hospital Stay (HOSPITAL_COMMUNITY)
Admission: AD | Admit: 2016-12-14 | Discharge: 2016-12-16 | DRG: 775 | Disposition: A | Discharge status: Home / Self Care | Payer: BC Managed Care – PPO | Source: Ambulatory Visit | Attending: Obstetrics and Gynecology | Admitting: Obstetrics and Gynecology

## 2016-12-14 ENCOUNTER — Encounter (HOSPITAL_COMMUNITY): Payer: Self-pay | Admitting: *Deleted

## 2016-12-14 ENCOUNTER — Inpatient Hospital Stay (HOSPITAL_COMMUNITY): Payer: BC Managed Care – PPO

## 2016-12-14 DIAGNOSIS — Z3A38 38 weeks gestation of pregnancy: Secondary | ICD-10-CM

## 2016-12-14 DIAGNOSIS — Z3493 Encounter for supervision of normal pregnancy, unspecified, third trimester: Secondary | ICD-10-CM | POA: Diagnosis present

## 2016-12-14 LAB — POCT FERN TEST: POCT FERN TEST: NEGATIVE

## 2016-12-14 MED ORDER — ZOLPIDEM TARTRATE 5 MG PO TABS: 5.0000 mg | ORAL_TABLET | Freq: Every evening | ORAL | Status: DC | PRN

## 2016-12-14 MED ORDER — TETANUS-DIPHTH-ACELL PERTUSSIS 5-2.5-18.5 LF-MCG/0.5 IM SUSP: 0.5000 mL | Freq: Once | INTRAMUSCULAR | Status: DC

## 2016-12-14 MED ORDER — IBUPROFEN 600 MG PO TABS
600.0000 mg | ORAL_TABLET | Freq: Four times a day (QID) | ORAL | Status: DC
  Administered 2016-12-14 – 2016-12-16 (×9): 600 mg via ORAL
  Filled 2016-12-14 (×9): qty 1

## 2016-12-14 MED ORDER — COCONUT OIL OIL: 1.0000 "application " | TOPICAL_OIL | Status: DC | PRN

## 2016-12-14 MED ORDER — SIMETHICONE 80 MG PO CHEW: 80.0000 mg | CHEWABLE_TABLET | ORAL | Status: DC | PRN

## 2016-12-14 MED ORDER — ONDANSETRON HCL 4 MG/2ML IJ SOLN: 4.0000 mg | INTRAMUSCULAR | Status: DC | PRN

## 2016-12-14 MED ORDER — DIBUCAINE 1 % RE OINT: 1.0000 "application " | TOPICAL_OINTMENT | RECTAL | Status: DC | PRN

## 2016-12-14 MED ORDER — FLEET ENEMA 7-19 GM/118ML RE ENEM: 1.0000 | ENEMA | Freq: Every day | RECTAL | Status: DC | PRN

## 2016-12-14 MED ORDER — BISACODYL 10 MG RE SUPP: 10.0000 mg | Freq: Every day | RECTAL | Status: DC | PRN

## 2016-12-14 MED ORDER — MEASLES, MUMPS & RUBELLA VAC ~~LOC~~ INJ
0.5000 mL | INJECTION | Freq: Once | SUBCUTANEOUS | Status: DC
  Filled 2016-12-14: qty 0.5

## 2016-12-14 MED ORDER — WITCH HAZEL-GLYCERIN EX PADS: 1.0000 "application " | MEDICATED_PAD | CUTANEOUS | Status: DC | PRN

## 2016-12-14 MED ORDER — OXYCODONE-ACETAMINOPHEN 5-325 MG PO TABS: 2.0000 | ORAL_TABLET | ORAL | Status: DC | PRN

## 2016-12-14 MED ORDER — BENZOCAINE-MENTHOL 20-0.5 % EX AERO
1.0000 "application " | INHALATION_SPRAY | CUTANEOUS | Status: DC | PRN
  Administered 2016-12-14: 1 via TOPICAL
  Filled 2016-12-14 (×2): qty 56

## 2016-12-14 MED ORDER — LIDOCAINE HCL (PF) 1 % IJ SOLN
INTRAMUSCULAR | Status: AC
  Administered 2016-12-14: 30 mL
  Filled 2016-12-14: qty 30

## 2016-12-14 MED ORDER — SENNOSIDES-DOCUSATE SODIUM 8.6-50 MG PO TABS
2.0000 | ORAL_TABLET | ORAL | Status: DC
  Administered 2016-12-14 – 2016-12-15 (×2): 2 via ORAL
  Filled 2016-12-14 (×2): qty 2

## 2016-12-14 MED ORDER — OXYTOCIN 10 UNIT/ML IJ SOLN
INTRAMUSCULAR | Status: AC
  Administered 2016-12-14: 10 [IU]
  Filled 2016-12-14: qty 1

## 2016-12-14 MED ORDER — ACETAMINOPHEN 325 MG PO TABS: 650.0000 mg | ORAL_TABLET | ORAL | Status: DC | PRN

## 2016-12-14 MED ORDER — DIPHENHYDRAMINE HCL 25 MG PO CAPS: 25.0000 mg | ORAL_CAPSULE | Freq: Four times a day (QID) | ORAL | Status: DC | PRN

## 2016-12-14 MED ORDER — OXYCODONE-ACETAMINOPHEN 5-325 MG PO TABS: 1.0000 | ORAL_TABLET | ORAL | Status: DC | PRN

## 2016-12-14 MED ORDER — PRENATAL MULTIVITAMIN CH
1.0000 | ORAL_TABLET | Freq: Every day | ORAL | Status: DC
  Administered 2016-12-15 – 2016-12-16 (×2): 1 via ORAL
  Filled 2016-12-14 (×2): qty 1

## 2016-12-14 MED ORDER — MEDROXYPROGESTERONE ACETATE 150 MG/ML IM SUSP: 150.0000 mg | INTRAMUSCULAR | Status: DC | PRN

## 2016-12-14 MED ORDER — ONDANSETRON HCL 4 MG PO TABS: 4.0000 mg | ORAL_TABLET | ORAL | Status: DC | PRN

## 2016-12-14 NOTE — Progress Notes (Signed)
Late entry: Called to room during BPP as patient began saying she needed to push. Cervical exam: 10 /100/+1. Delivery kit summoned; patient positioned in lithotomy position. At 1056 a viable infant girl was delivered ROA. One loose nuchal cord somersaulted through. Placed on maternal abdomen; stimulated to cry. Cord cut and clamped after 60 seconds. Dr. Helane Rima at the bedside at  11 am. Care endorsed to Dr. Helane Rima.

## 2016-12-14 NOTE — MAU Note (Signed)
Pt reports she has been having ctx since 4:30 about 5 min a part. Reports a pinkish discharge and good fetal  movement

## 2016-12-14 NOTE — MAU Note (Signed)
Pt became more uncomfortable after being checked again and minutes after being checked started pushing and the fetal head was seen at the perineum. Kooistra CNM and Elenora Fender CNM were at bedside to help with delivery. Pt delivered with no complications and baby was put skin to skin on mom.

## 2016-12-14 NOTE — MAU Note (Signed)
Ultrasound came to bedside to do BPP at 1025. Pt became increasingly uncomfortable during the BPP. Ultrasound tech was telling me the BPP was looking like a 2/8 preliminary and at that time the patient was more uncomfortable and Kooistra CNM came to the bedside to check the patient again because she was so uncomfortable. She checked her and she said she was an 8 or 9 and we asked nurses at the desk to call Dr Vincente Poli.

## 2016-12-14 NOTE — MAU Note (Signed)
Unable to feel cervix and asked Kooistra CNM to check after me. She determined she was closed and very posterior.

## 2016-12-14 NOTE — MAU Note (Signed)
Dr Vincente Poli was told about variable FHR, provider ordered BPP.

## 2016-12-14 NOTE — H&P (Signed)
36 year old G 2 P 1 presented this am to MAU with contractions. She was initially evaluated by the nurse and was closed.  The nurse contacted me to inform me of the exam and told me she was having variables.  I instructed her to obtain a BPP.  Next phone call was the patient was delivering in triage.  When I arrived, The baby was on mom's chest and she was doing very well.  I delivered the placenta spontaneously , noted to be intact with 3 vessel cord.  First degree laceration closed with 3.0 vicryl  EBL 300 cc  Patient to be transferred to postpartum

## 2016-12-14 NOTE — MAU Note (Signed)
Provider called but was in delivery.  Informed charge nurse of exam and to inform Dr Vincente Poli of exam when she called back.

## 2016-12-15 LAB — CBC
HCT: 30.1 % — ABNORMAL LOW (ref 36.0–46.0)
HEMOGLOBIN: 10.1 g/dL — AB (ref 12.0–15.0)
MCH: 27.8 pg (ref 26.0–34.0)
MCHC: 33.6 g/dL (ref 30.0–36.0)
MCV: 82.9 fL (ref 78.0–100.0)
Platelets: 170 10*3/uL (ref 150–400)
RBC: 3.63 MIL/uL — ABNORMAL LOW (ref 3.87–5.11)
RDW: 13.6 % (ref 11.5–15.5)
WBC: 10.5 10*3/uL (ref 4.0–10.5)

## 2016-12-15 NOTE — Lactation Note (Signed)
This note was copied from a baby's chart. Lactation Consultation Note Mom has 35 yr old that she BF for 1 yr. Mom stated she had mastitis X2. She believed the 2nd time was right after the first one and doesn't believe it was healed.  Mom has very Large pendulum breast w/LARGE nipples. Mom stated her 35 yr old didn't have any trouble latching after birth. Mom stated this baby has lacthed well several times, but now is sleepy and not interested.  Encouraged mom to latch in football position to obtain deep latch and mom and staff will be able to see if latch is good.  Discussed w/mom obtaining a deep latch, making sure all of the nipple is in mouth and baby isn't sucking of nipple, that the nipple in deep in mouth. (LC concerned nipples are to large at this time for effect transfer).        Encouraged to call for next feeding for staff to see latch.  Reviewed newborn behavior, I&O, cluster feeding, STS. Asked mom to hand express. Mom stated she didn't know how, she didn't remember ever doing that w/her first child. Taught hand expression w/colostrum collected 2 ml. Discussed spoon feeding to stimulate baby to feed.  Large breast has dependent edema to lower breast. Encouraged to wear bra for support.  Baby has really bruise face. Discussed jaundice, importance of feeding, having in and out put.  Mom encouraged to feed baby 8-12 times/24 hours and with feeding cues. Mom encouraged to waken baby for feeds. Encouraged to call for assistance and questions.   [facility] brochure given w/resources, support groups and LC services. Patient Name: Bailey Wright Today's Date: 12/15/2016 Reason for consult: Initial assessment   Maternal Data Has patient been taught Hand Expression?: Yes Does the patient have breastfeeding experience prior to this delivery?: Yes  Feeding    LATCH Score/Interventions Latch: Too sleepy or reluctant, no latch achieved, no sucking elicited.     Type of Nipple: Everted at rest  and after stimulation  Comfort (Breast/Nipple): Soft / non-tender     Intervention(s): Breastfeeding basics reviewed;Support Pillows;Position options;Skin to skin     Lactation Tools Discussed/Used WIC Program: No   Consult Status Consult Status: Follow-up Date: 12/15/16 Follow-up type: In-patient    Norberto Wishon, Diamond Nickel 12/15/2016, 2:52 AM

## 2016-12-15 NOTE — Progress Notes (Signed)
Post Partum Day 1 Subjective: no complaints, up ad lib, voiding and tolerating PO  Objective: Blood pressure 132/80, pulse 71, temperature 98.4 F (36.9 C), temperature source Oral, resp. rate 18, height  (1.676 m), weight 224 lb (101.6 kg), SpO2 100 %, unknown if currently breastfeeding.  Physical Exam:  General: alert and cooperative Lochia: appropriate Uterine Fundus: firm Incision: healing well DVT Evaluation: No evidence of DVT seen on physical exam. Negative Homan's sign. No cords or calf tenderness. No significant calf/ankle edema.   Recent Labs  12/15/16 0524  HGB 10.1*  HCT 30.1*    Assessment/Plan: Plan for discharge tomorrow and Breastfeeding   LOS: 1 day   Bailey Wright G 12/15/2016, 8:04 AM

## 2016-12-15 NOTE — Lactation Note (Signed)
This note was copied from a baby's chart. Lactation Consultation Note  Mother denies problems or concerns.  Mother states baby is breastfeeding well. Baby sleeping on father's chest. Discussed breastfeeding on both breasts per feeding and cluster feeding. Mom encouraged to feed baby 8-12 times/24 hours and with feeding cues.  Suggest she call if she needs further assistance.  Patient Name: Bailey Wright Common [id number] Date: 12/15/2016 Reason for consult: Follow-up assessment   Maternal Data    Feeding Feeding Type: Breast Fed Length of feed: 20 min  LATCH Score/Interventions                      Lactation Tools Discussed/Used     Consult Status Consult Status: Follow-up Date: 12/16/16 Follow-up type: In-patient    Dahlia Byes [facility] 12/15/2016, 9:29 PM

## 2016-12-16 LAB — RPR: RPR Ser Ql: NONREACTIVE

## 2016-12-16 MED ORDER — OXYCODONE-ACETAMINOPHEN 5-325 MG PO TABS: 1.0000 | ORAL_TABLET | ORAL | 0 refills | Status: AC | PRN

## 2016-12-16 MED ORDER — IBUPROFEN 600 MG PO TABS: 600.0000 mg | ORAL_TABLET | Freq: Four times a day (QID) | ORAL | 1 refills | Status: AC | PRN

## 2016-12-16 NOTE — Discharge Summary (Signed)
Obstetric Discharge Summary Reason for Admission: onset of labor Prenatal Procedures: none Intrapartum Procedures: spontaneous vaginal delivery Postpartum Procedures: none Complications-Operative and Postpartum: none Hemoglobin  Date Value Ref Range Status  12/15/2016 10.1 (L) 12.0 - 15.0 g/dL Final   HCT  Date Value Ref Range Status  12/15/2016 30.1 (L) 36.0 - 46.0 % Final    Physical Exam:  General: alert Lochia: appropriate Uterine Fundus: firm Incision: healing well DVT Evaluation: No evidence of DVT seen on physical exam.  Discharge Diagnoses: Term Pregnancy-delivered  Discharge Information: Date: 12/16/2016 Activity: pelvic rest Diet: routine Medications: PNV+Motrin+Percocet Condition: stable Instructions: refer to practice specific booklet Discharge to: home Follow-up Information    [facility] Follow up in 6 week(s).   Contact information: [address] [place] [address] [phone]           Newborn Data: Live born female  Birth Weight: 8 lb 7.1 oz (3830 g) APGAR: 8, 9  Home with mother.  Velera Lansdale M 12/16/2016, 8:01 AM

## 2016-12-16 NOTE — Lactation Note (Signed)
This note was copied from a baby's chart. Lactation Consultation Note  Patient Name: Bailey Wright [id number] Date: 12/16/2016 Reason for consult: Follow-up assessment (5% weight  loss, Serum bili at 0613 = 10.8, )  Baby is 47 hours old  Per mom recently breast fed at 10 am for 15 mins.  Per mom nipples tender initially and improves, swallows noted with feedings.  Per mom has a DEBP - Ameda from her 1st baby that worked well.  LC instructed mom  On the use of hand pump, #24 Flange ok for today.  #27 Flange provided for when the milk comes in. Hand pump given for pre - pumping for sore nipple and engorgement prevention and tx  When checking flange noted the nipples to pinky red - encouraged hand expressing -  LC reviewed, several large drops of colostrum, areolas compressible.  LC reviewed and updated doc flow sheets with parents. Mother informed of post-discharge support and given phone number to the lactation department, including services for phone call assistance; out-patient appointments; and breastfeeding support group. List of other breastfeeding resources in the community given in the handout. Encouraged mother to call for problems or concerns related to breastfeeding.   Maternal Data    Feeding Feeding Type: Breast Fed Length of feed: 15 min (per mom )  LATCH Score/Interventions                Intervention(s): Breastfeeding basics reviewed     Lactation Tools Discussed/Used Tools: Pump;Flanges Flange Size: 27 Breast pump type: Manual WIC Program: No Pump Review: Milk Storage;Setup, frequency, and cleaning (Reviewed )   Consult Status Consult Status: Complete Date: 12/16/16    Kathrin Greathouse 12/16/2016, 10:52 AM

## 2016-12-26 NOTE — H&P (Signed)
Rob HickmanLauren S Degraffenreid is a 36 y.o. G 2 P 1 at term in labor OB History as of 12/16/16    Gravida Para Term Preterm AB Living   2 2 2  0 0 1   SAB TAB Ectopic Multiple Live Births   0 0 0 0 1     Past Medical History:  Diagnosis Date  . Anxiety   . Degenerative disc disease    diagnosed in high school  . Depression   . Vaginal Pap smear, abnormal    Past Surgical History:  Procedure Laterality Date  . BACK SURGERY  Dec. 2012   ruptured disc repair  . DILATION AND EVACUATION N/A 05/04/2015   Procedure: DILATATION AND EVACUATION;  Surgeon: Richarda Overlieichard Holland, MD;  Location: [facility];  Service: Gynecology;  Laterality: N/A;  . EYE SURGERY     lasik   Family History: family history includes Cancer in her maternal grandmother; Diabetes in her maternal grandmother and sister; Heart disease in her maternal grandfather; Thyroid disease in her mother. Social History:  reports that she has never smoked. She has never used smokeless tobacco. She reports that she does not drink alcohol or use drugs.     Maternal Diabetes: No Genetic Screening: Normal Maternal Ultrasounds/Referrals: Normal Fetal Ultrasounds or other Referrals:  None Maternal Substance Abuse:  No Significant Maternal Medications:  None Significant Maternal Lab Results:  None Other Comments:  None  Review of Systems  All other systems reviewed and are negative.  Maternal Medical History:  Reason for admission: Contractions.     Dilation: Closed Effacement (%): 90 Station: -3 Exam by:: Kooistra Blood pressure 123/84, pulse 66, temperature 98.5 F (36.9 C), temperature source Oral, resp. rate 18, height 5\' 6"  (1.676 m), weight 101.6 kg (224 lb), SpO2 100 %, unknown if currently breastfeeding. Maternal Exam:  Abdomen: Fetal presentation: vertex     Fetal Exam Fetal State Assessment: Category I - tracings are normal.     Physical Exam  Nursing note and vitals reviewed. Constitutional: She appears well-developed and  well-nourished.  HENT:  Head: Normocephalic.  Eyes: Pupils are equal, round, and reactive to light.  Cardiovascular: Normal rate and regular rhythm.   Respiratory: Effort normal.    Prenatal labs: ABO, Rh:   Antibody:   Rubella: Immune (09/28 0000) RPR: Non Reactive (04/27 0524)  HBsAg: Negative (09/28 0000)  HIV: Non-reactive (09/28 0000)  GBS: Negative (04/10 0000)   Assessment/Plan: Labor Patient delivered in triage   Kawehi Hostetter L 12/26/2016, 4:31 PM

## 2019-10-26 ENCOUNTER — Ambulatory Visit: Payer: BC Managed Care – PPO | Attending: Internal Medicine

## 2019-10-26 DIAGNOSIS — Z23 Encounter for immunization: Secondary | ICD-10-CM | POA: Insufficient documentation

## 2019-10-26 NOTE — Progress Notes (Signed)
   Covid-19 Vaccination Clinic  Name:  Bailey Wright    MRN: [id number] DOB: 10/06/1980  10/26/2019  Bailey Wright was observed post Covid-19 immunization for 15 minutes without incident. She was provided with Vaccine Information Sheet and instruction to access the V-Safe system.   Bailey Wright was instructed to call 911 with any severe reactions post vaccine: Marland Kitchen Difficulty breathing  . Swelling of face and throat  . A fast heartbeat  . A bad rash all over body  . Dizziness and weakness   Immunizations Administered    Name Date Dose VIS Date Route   Pfizer COVID-19 Vaccine 10/26/2019  2:36 PM 0.3 mL 08/01/2019 Intramuscular   Manufacturer: [employer], [employer]   Lot: [id number]   NDC: [address]-6

## 2019-11-16 ENCOUNTER — Ambulatory Visit: Payer: BC Managed Care – PPO | Attending: Internal Medicine

## 2019-11-16 DIAGNOSIS — Z23 Encounter for immunization: Secondary | ICD-10-CM

## 2019-11-16 NOTE — Progress Notes (Signed)
   Covid-19 Vaccination Clinic  Name:  TAISLEY MORDAN    MRN: [id number] DOB: 1981/01/28  11/16/2019  Ms. Bixby was observed post Covid-19 immunization for 15 minutes without incident. She was provided with Vaccine Information Sheet and instruction to access the V-Safe system.   Ms. Hussar was instructed to call 911 with any severe reactions post vaccine: Marland Kitchen Difficulty breathing  . Swelling of face and throat  . A fast heartbeat  . A bad rash all over body  . Dizziness and weakness   Immunizations Administered    Name Date Dose VIS Date Route   Pfizer COVID-19 Vaccine 11/16/2019  1:12 PM 0.3 mL 08/01/2019 Intramuscular   Manufacturer: [employer], [employer]   Lot: [id number]   NDC: [address]-9

## 2023-11-22 ENCOUNTER — Ambulatory Visit: Admitting: *Deleted

## 2024-03-23 ENCOUNTER — Emergency Department (HOSPITAL_BASED_OUTPATIENT_CLINIC_OR_DEPARTMENT_OTHER)

## 2024-03-23 ENCOUNTER — Encounter (HOSPITAL_BASED_OUTPATIENT_CLINIC_OR_DEPARTMENT_OTHER): Payer: Self-pay

## 2024-03-23 ENCOUNTER — Emergency Department (HOSPITAL_BASED_OUTPATIENT_CLINIC_OR_DEPARTMENT_OTHER)
Admission: EM | Admit: 2024-03-23 | Discharge: 2024-03-23 | Disposition: A | Discharge status: Home / Self Care | Attending: Emergency Medicine | Admitting: Emergency Medicine

## 2024-03-23 ENCOUNTER — Other Ambulatory Visit: Payer: Self-pay

## 2024-03-23 DIAGNOSIS — G8929 Other chronic pain: Secondary | ICD-10-CM | POA: Diagnosis not present

## 2024-03-23 DIAGNOSIS — M5416 Radiculopathy, lumbar region: Secondary | ICD-10-CM | POA: Diagnosis not present

## 2024-03-23 DIAGNOSIS — M545 Low back pain, unspecified: Secondary | ICD-10-CM | POA: Diagnosis present

## 2024-03-23 LAB — PREGNANCY, URINE: Preg Test, Ur: NEGATIVE

## 2024-03-23 MED ORDER — DIAZEPAM 5 MG/ML IJ SOLN
2.5000 mg | Freq: Once | INTRAMUSCULAR | Status: AC
  Administered 2024-03-23: 2.5 mg via INTRAVENOUS
  Filled 2024-03-23: qty 2

## 2024-03-23 MED ORDER — KETOROLAC TROMETHAMINE 30 MG/ML IJ SOLN
30.0000 mg | Freq: Once | INTRAMUSCULAR | Status: AC
  Administered 2024-03-23: 30 mg via INTRAVENOUS
  Filled 2024-03-23: qty 1

## 2024-03-23 MED ORDER — DEXAMETHASONE SODIUM PHOSPHATE 10 MG/ML IJ SOLN
10.0000 mg | Freq: Once | INTRAMUSCULAR | Status: AC
  Administered 2024-03-23: 10 mg via INTRAVENOUS
  Filled 2024-03-23: qty 1

## 2024-03-23 MED ORDER — SODIUM CHLORIDE 0.9 % IV BOLUS
1000.0000 mL | Freq: Once | INTRAVENOUS | Status: AC
  Administered 2024-03-23: 1000 mL via INTRAVENOUS

## 2024-03-23 MED ORDER — HYDROMORPHONE HCL 1 MG/ML IJ SOLN
1.0000 mg | Freq: Once | INTRAMUSCULAR | Status: AC
  Administered 2024-03-23: 1 mg via INTRAVENOUS
  Filled 2024-03-23: qty 1

## 2024-03-23 MED ORDER — OXYCODONE HCL 5 MG PO TABS: 2.5000 mg | ORAL_TABLET | ORAL | 0 refills | Status: AC | PRN

## 2024-03-23 NOTE — ED Provider Notes (Signed)
 [facility] AT [facility] Provider Note   CSN: [id number] Arrival date & time: 03/23/24  1155     Patient presents with: Sciatica and Back Pain   Story Bailey Wright is a 43 y.o. female with a past medical history of anxiety and degenerative disc disease who presents emergency department chief complaint of back pain and sciatic symptoms.  She has a past medical history of right sided lumbar radiculopathy.  She reports that she had a spinal surgery in 2012 and has had intermittent back pain on and off since then however in December 2024 she had recurrence of fairly significant right sided back pain.  She has been treated by Dr. Malcolm at [facility] neurosurgery and spine.  Her last spinal epidural injection was in February 2025.  Patient has been having waxing and waning symptoms however over the past 2 weeks began having severe pain radiating down her leg which has become worse.  She called Dr. Malcolm and was placed on a Medrol pack.  She has been taking Tylenol  and ibuprofen .  She states her symptoms have been progressively worsening and are severe today, 10 out of 10.  She feels like her leg is weak but it is not giving way.  She denies saddle anesthesia bowel or bladder incontinence.  {Add pertinent medical, surgical, social history, OB history to HPI:32947}  Back Pain      Prior to Admission medications   Medication Sig Start Date End Date Taking? Authorizing Provider  ibuprofen  (ADVIL ,MOTRIN ) 600 MG tablet Take 1 tablet (600 mg total) by mouth every 6 (six) hours as needed. 12/16/16   Johnnye Ade, MD  oxyCODONE -acetaminophen  (PERCOCET/ROXICET) 5-325 MG tablet Take 1-2 tablets by mouth every 4 (four) hours as needed (pain scale > 7). 12/16/16   Johnnye Ade, MD  Prenatal Vit-Fe Fumarate-FA (PRENATAL MULTIVITAMIN) TABS tablet Take 1 tablet by mouth daily at 12 noon.    [provider]    Allergies: Penicillins    Review of Systems   Musculoskeletal:  Positive for back pain.    Updated Vital Signs BP (!) 167/120 (BP Location: Right Arm)   Pulse 100   Temp 98.4 F (36.9 C) (Oral)   Resp 20   Ht 5' 6 (1.676 m)   Wt 95.3 kg   LMP 03/21/2024   SpO2 100%   BMI 33.89 kg/m   Physical Exam Vitals and nursing note reviewed.  Constitutional:      General: She is not in acute distress.    Appearance: She is well-developed. She is not diaphoretic.     Comments: Patient appears very uncomfortable comfortable  HENT:     Head: Normocephalic and atraumatic.     Right Ear: External ear normal.     Left Ear: External ear normal.     Nose: Nose normal.     Mouth/Throat:     Mouth: Mucous membranes are moist.  Eyes:     General: No scleral icterus.    Conjunctiva/sclera: Conjunctivae normal.  Cardiovascular:     Rate and Rhythm: Normal rate and regular rhythm.     Heart sounds: Normal heart sounds. No murmur heard.    No friction rub. No gallop.  Pulmonary:     Effort: Pulmonary effort is normal. No respiratory distress.     Breath sounds: Normal breath sounds.  Abdominal:     General: Bowel sounds are normal. There is no distension.     Palpations: Abdomen is soft. There is no mass.  Tenderness: There is no abdominal tenderness. There is no guarding.  Musculoskeletal:     Cervical back: Normal range of motion.  Skin:    General: Skin is warm and dry.  Neurological:     Mental Status: She is alert and oriented to person, place, and time.     Comments: Patient is ambulatory in the emergency department to the bathroom but appears antalgic Equal bilateral ankle strength with dorsi and plantarflexion, normal strength with hip flexion extension abduction and abduction. Equal bilateral patellar reflexes 1+. 2+ radial pulses with normal sensation bilateral lower extremities  Psychiatric:        Behavior: Behavior normal.    (all labs ordered are listed, but only abnormal results are displayed) Labs Reviewed -  No data to display  EKG: None  Radiology: No results found.  {Document cardiac monitor, telemetry assessment procedure when appropriate:32947} Procedures   Medications Ordered in the ED - No data to display    {Click here for ABCD2, HEART and other calculators REFRESH Note before signing:1}                              Medical Decision Making  ***  {Document critical care time when appropriate  Document review of labs and clinical decision tools ie CHADS2VASC2, etc  Document your independent review of radiology images and any outside records  Document your discussion with family members, caretakers and with consultants  Document social determinants of health affecting pt's care  Document your decision making why or why not admission, treatments were needed:32947:::1}   Final diagnoses:  None    ED Discharge Orders     None

## 2024-03-23 NOTE — ED Triage Notes (Signed)
 Pt reports R lower back pain radiating down R leg. Pt reports increased pain over the last week. Pt started Prednisone pack x4 days ago.

## 2024-03-23 NOTE — ED Notes (Signed)
 Pt d/c instructions, medications, and follow-up care reviewed with pt. Pt verbalized understanding and had no further questions at time of d/c. Pt CA&Ox4, ambulatory, and in NAD at time of d/c

## 2024-03-23 NOTE — Discharge Instructions (Signed)
 SEEK IMMEDIATE MEDICAL ATTENTION IF: New numbness, tingling, weakness, or problem with the use of your arms or legs.  Severe back pain not relieved with medications.  Change in bowel or bladder control.  Increasing pain in any areas of the body (such as chest or abdominal pain).  Shortness of breath, dizziness or fainting.  Nausea (feeling sick to your stomach), vomiting, fever, or sweats.
